# Patient Record
Sex: Female | Born: 1990 | Race: Black or African American | Hispanic: No | Marital: Single | Smoking: Former smoker
Health system: Southern US, Community
[De-identification: ages and names within clinical notes are randomized; demographics above are authoritative.]

## PROBLEM LIST (undated history)

## (undated) ENCOUNTER — Inpatient Hospital Stay: Payer: Self-pay

## (undated) DIAGNOSIS — D649 Anemia, unspecified: Secondary | ICD-10-CM

## (undated) DIAGNOSIS — B001 Herpesviral vesicular dermatitis: Secondary | ICD-10-CM

## (undated) DIAGNOSIS — Z9289 Personal history of other medical treatment: Secondary | ICD-10-CM

## (undated) HISTORY — PX: ABDOMINAL SURGERY: SHX537

## (undated) HISTORY — PX: THERAPEUTIC ABORTION: SHX798

---
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
## 2009-06-01 ENCOUNTER — Emergency Department: Payer: Self-pay | Admitting: Emergency Medicine

## 2009-06-12 ENCOUNTER — Emergency Department: Payer: Self-pay

## 2010-01-18 ENCOUNTER — Emergency Department: Payer: Self-pay | Admitting: Emergency Medicine

## 2010-03-16 ENCOUNTER — Emergency Department: Payer: Self-pay | Admitting: Emergency Medicine

## 2010-04-23 ENCOUNTER — Encounter: Payer: Self-pay | Admitting: Maternal & Fetal Medicine

## 2010-04-25 ENCOUNTER — Emergency Department: Payer: Self-pay | Admitting: Emergency Medicine

## 2010-06-30 ENCOUNTER — Observation Stay: Payer: Self-pay

## 2010-08-03 ENCOUNTER — Observation Stay: Payer: Self-pay

## 2010-08-09 ENCOUNTER — Observation Stay: Payer: Self-pay | Admitting: Obstetrics and Gynecology

## 2010-08-30 ENCOUNTER — Observation Stay: Payer: Self-pay | Admitting: Obstetrics & Gynecology

## 2010-09-03 ENCOUNTER — Observation Stay: Payer: Self-pay | Admitting: Obstetrics and Gynecology

## 2010-09-10 ENCOUNTER — Observation Stay: Payer: Self-pay | Admitting: Obstetrics and Gynecology

## 2010-09-11 ENCOUNTER — Inpatient Hospital Stay: Payer: Self-pay | Admitting: Obstetrics and Gynecology

## 2010-11-27 LAB — HM PAP SMEAR: HM Pap smear: NEGATIVE

## 2011-02-26 ENCOUNTER — Emergency Department: Payer: Self-pay | Admitting: Emergency Medicine

## 2012-08-16 ENCOUNTER — Emergency Department: Payer: Self-pay | Admitting: Emergency Medicine

## 2012-09-25 ENCOUNTER — Emergency Department: Payer: Self-pay | Admitting: Emergency Medicine

## 2012-12-01 ENCOUNTER — Emergency Department: Payer: Self-pay | Admitting: Emergency Medicine

## 2012-12-05 ENCOUNTER — Emergency Department: Payer: Self-pay | Admitting: Internal Medicine

## 2014-03-26 ENCOUNTER — Emergency Department: Payer: Self-pay | Admitting: Internal Medicine

## 2014-04-05 ENCOUNTER — Emergency Department: Payer: Self-pay | Admitting: Emergency Medicine

## 2014-04-16 ENCOUNTER — Emergency Department: Payer: Self-pay | Admitting: Internal Medicine

## 2014-04-16 LAB — CBC WITH DIFFERENTIAL/PLATELET
BASOS ABS: 0 10*3/uL (ref 0.0–0.1)
BASOS PCT: 0.5 %
EOS PCT: 1.2 %
Eosinophil #: 0.1 10*3/uL (ref 0.0–0.7)
HCT: 38.6 % (ref 35.0–47.0)
HGB: 12.1 g/dL (ref 12.0–16.0)
LYMPHS PCT: 35.7 %
Lymphocyte #: 3.1 10*3/uL (ref 1.0–3.6)
MCH: 26.9 pg (ref 26.0–34.0)
MCHC: 31.2 g/dL — ABNORMAL LOW (ref 32.0–36.0)
MCV: 86 fL (ref 80–100)
Monocyte #: 0.9 x10 3/mm (ref 0.2–0.9)
Monocyte %: 10.1 %
NEUTROS ABS: 4.6 10*3/uL (ref 1.4–6.5)
NEUTROS PCT: 52.5 %
Platelet: 420 10*3/uL (ref 150–440)
RBC: 4.48 10*6/uL (ref 3.80–5.20)
RDW: 14.1 % (ref 11.5–14.5)
WBC: 8.7 10*3/uL (ref 3.6–11.0)

## 2014-04-16 LAB — COMPREHENSIVE METABOLIC PANEL
Albumin: 4.1 g/dL (ref 3.4–5.0)
Alkaline Phosphatase: 111 U/L
Anion Gap: 8 (ref 7–16)
BUN: 6 mg/dL — AB (ref 7–18)
Bilirubin,Total: 0.4 mg/dL (ref 0.2–1.0)
CALCIUM: 9 mg/dL (ref 8.5–10.1)
CHLORIDE: 103 mmol/L (ref 98–107)
CREATININE: 0.76 mg/dL (ref 0.60–1.30)
Co2: 28 mmol/L (ref 21–32)
EGFR (Non-African Amer.): >60
Glucose: 82 mg/dL (ref 65–99)
OSMOLALITY: 274 (ref 275–301)
Potassium: 3.7 mmol/L (ref 3.5–5.1)
SGOT(AST): 26 U/L (ref 15–37)
SGPT (ALT): 23 U/L
Sodium: 139 mmol/L (ref 136–145)
Total Protein: 8.6 g/dL — ABNORMAL HIGH (ref 6.4–8.2)

## 2014-04-16 LAB — URINALYSIS, COMPLETE
Bacteria: NONE SEEN
Bilirubin,UR: NEGATIVE
Blood: NEGATIVE
Glucose,UR: NEGATIVE mg/dL (ref 0–75)
Ketone: NEGATIVE
NITRITE: NEGATIVE
PH: 6 (ref 4.5–8.0)
Protein: NEGATIVE
RBC,UR: 12 /HPF (ref 0–5)
SPECIFIC GRAVITY: 1.018 (ref 1.003–1.030)
Squamous Epithelial: 5
WBC UR: 39 /HPF (ref 0–5)

## 2014-04-16 LAB — LIPASE, BLOOD: Lipase: 104 U/L (ref 73–393)

## 2014-04-17 LAB — URINE CULTURE

## 2014-07-07 ENCOUNTER — Emergency Department: Payer: Self-pay | Admitting: Emergency Medicine

## 2014-07-09 ENCOUNTER — Emergency Department: Payer: Self-pay | Admitting: Emergency Medicine

## 2014-07-15 ENCOUNTER — Emergency Department: Payer: Self-pay | Admitting: Emergency Medicine

## 2014-08-02 LAB — OB RESULTS CONSOLE HIV ANTIBODY (ROUTINE TESTING): HIV: NONREACTIVE

## 2014-08-02 LAB — OB RESULTS CONSOLE VARICELLA ZOSTER ANTIBODY, IGG: Varicella: IMMUNE

## 2014-08-02 LAB — OB RESULTS CONSOLE RPR: RPR: NONREACTIVE

## 2014-08-02 LAB — OB RESULTS CONSOLE HEPATITIS B SURFACE ANTIGEN: HEP B S AG: NEGATIVE

## 2014-08-02 LAB — OB RESULTS CONSOLE RUBELLA ANTIBODY, IGM: RUBELLA: IMMUNE

## 2014-08-09 ENCOUNTER — Emergency Department: Payer: Self-pay | Admitting: Emergency Medicine

## 2014-08-12 ENCOUNTER — Ambulatory Visit: Payer: Self-pay

## 2014-10-22 ENCOUNTER — Emergency Department: Payer: Medicaid Other

## 2014-10-22 ENCOUNTER — Encounter: Payer: Self-pay | Admitting: Emergency Medicine

## 2014-10-22 ENCOUNTER — Emergency Department
Admission: EM | Admit: 2014-10-22 | Discharge: 2014-10-22 | Disposition: A | Discharge status: Home / Self Care | Payer: Medicaid Other | Attending: Student | Admitting: Student

## 2014-10-22 ENCOUNTER — Other Ambulatory Visit: Payer: Self-pay

## 2014-10-22 DIAGNOSIS — Z3A19 19 weeks gestation of pregnancy: Secondary | ICD-10-CM | POA: Diagnosis not present

## 2014-10-22 DIAGNOSIS — R52 Pain, unspecified: Secondary | ICD-10-CM

## 2014-10-22 DIAGNOSIS — R1013 Epigastric pain: Secondary | ICD-10-CM | POA: Diagnosis not present

## 2014-10-22 DIAGNOSIS — O9989 Other specified diseases and conditions complicating pregnancy, childbirth and the puerperium: Secondary | ICD-10-CM | POA: Insufficient documentation

## 2014-10-22 DIAGNOSIS — Z79899 Other long term (current) drug therapy: Secondary | ICD-10-CM | POA: Insufficient documentation

## 2014-10-22 DIAGNOSIS — Z87891 Personal history of nicotine dependence: Secondary | ICD-10-CM | POA: Insufficient documentation

## 2014-10-22 LAB — CBC WITH DIFFERENTIAL/PLATELET
BASOS ABS: 0.1 10*3/uL (ref 0–0.1)
Basophils Relative: 1 %
EOS ABS: 0.2 10*3/uL (ref 0–0.7)
EOS PCT: 2 %
HCT: 30.3 % — ABNORMAL LOW (ref 35.0–47.0)
HEMOGLOBIN: 9.9 g/dL — AB (ref 12.0–16.0)
Lymphocytes Relative: 29 %
Lymphs Abs: 3.1 10*3/uL (ref 1.0–3.6)
MCH: 27.3 pg (ref 26.0–34.0)
MCHC: 32.5 g/dL (ref 32.0–36.0)
MCV: 83.9 fL (ref 80.0–100.0)
MONO ABS: 0.7 10*3/uL (ref 0.2–0.9)
Monocytes Relative: 6 %
NEUTROS ABS: 7 10*3/uL — AB (ref 1.4–6.5)
NEUTROS PCT: 62 %
PLATELETS: 285 10*3/uL (ref 150–440)
RBC: 3.61 MIL/uL — AB (ref 3.80–5.20)
RDW: 13.1 % (ref 11.5–14.5)
WBC: 11 10*3/uL (ref 3.6–11.0)

## 2014-10-22 LAB — COMPREHENSIVE METABOLIC PANEL
ALBUMIN: 3.3 g/dL — AB (ref 3.5–5.0)
ALK PHOS: 70 U/L (ref 38–126)
ALT: 8 U/L — AB (ref 14–54)
AST: 21 U/L (ref 15–41)
Anion gap: 11 (ref 5–15)
CO2: 19 mmol/L — AB (ref 22–32)
Calcium: 8.6 mg/dL — ABNORMAL LOW (ref 8.9–10.3)
Chloride: 108 mmol/L (ref 101–111)
Creatinine, Ser: 0.48 mg/dL (ref 0.44–1.00)
GFR calc non Af Amer: >60 mL/min (ref >60)
GLUCOSE: 127 mg/dL — AB (ref 65–99)
Potassium: 3.1 mmol/L — ABNORMAL LOW (ref 3.5–5.1)
Sodium: 138 mmol/L (ref 135–145)
Total Bilirubin: 0.4 mg/dL (ref 0.3–1.2)
Total Protein: 6.7 g/dL (ref 6.5–8.1)

## 2014-10-22 LAB — URINALYSIS COMPLETE WITH MICROSCOPIC (ARMC ONLY)
Bilirubin Urine: NEGATIVE
Glucose, UA: NEGATIVE mg/dL
Hgb urine dipstick: NEGATIVE
KETONES UR: NEGATIVE mg/dL
LEUKOCYTES UA: NEGATIVE
NITRITE: NEGATIVE
PH: 5 (ref 5.0–8.0)
Protein, ur: 30 mg/dL — AB
Specific Gravity, Urine: 1.018 (ref 1.005–1.030)

## 2014-10-22 LAB — HCG, QUANTITATIVE, PREGNANCY: hCG, Beta Chain, Quant, S: 13265 m[IU]/mL — ABNORMAL HIGH (ref <5)

## 2014-10-22 LAB — LIPASE, BLOOD: Lipase: 24 U/L (ref 22–51)

## 2014-10-22 LAB — TROPONIN I: Troponin I: <0.03 ng/mL (ref <0.031)

## 2014-10-22 MED ORDER — SODIUM CHLORIDE 0.9 % IV BOLUS (SEPSIS)
1000.0000 mL | Freq: Once | INTRAVENOUS | Status: AC
  Administered 2014-10-22: 1000 mL via INTRAVENOUS

## 2014-10-22 MED ORDER — ACETAMINOPHEN 500 MG PO TABS
1000.0000 mg | ORAL_TABLET | Freq: Once | ORAL | Status: AC
  Administered 2014-10-22: 1000 mg via ORAL

## 2014-10-22 MED ORDER — ACETAMINOPHEN 500 MG PO TABS
ORAL_TABLET | ORAL | Status: AC
  Administered 2014-10-22: 1000 mg via ORAL
  Filled 2014-10-22: qty 2

## 2014-10-22 NOTE — ED Notes (Signed)
Patient with no complaints at this time. Respirations even and unlabored. Skin warm/dry. Discharge instructions reviewed with patient at this time. Patient given opportunity to voice concerns/ask questions. IV removed per policy and band-aid applied to site. Patient discharged at this time and left Emergency Department with steady gait.  

## 2014-10-22 NOTE — ED Notes (Signed)
Patient states that she is about [redacted] weeks pregnant and that she is having upper abd and left side pain times day and half. Patient denies nausea or vomiting.

## 2014-10-22 NOTE — ED Provider Notes (Addendum)
[facility] Emergency Department Provider Note  ____________________________________________  Time seen: Approximately 3:47 AM  I have reviewed the triage vital signs and the nursing notes.   HISTORY  Chief Complaint Abdominal Pain    HPI Veronica Walters is a 24 y.o. female with no chronic medical problems, G6P3 at approximately 20 weeks estimated gestational age presents for evaluation of 2 days of gradual onset intermittent epigastric and left upper quadrant abdominal pain. She describes the pain as sharp. Current severity is moderate. She has taken "one Tylenol" to treat her pain. She has had no nausea, vomiting, diarrhea, fevers or chills. There are no modifying factors. She denies any chest pain or difficulty breathing. The pain in her abdomen is not worse with deep inspiration. She continues to feel her baby move, she has had no abnormal vaginal bleeding, no loss of fluid from her vagina.   History reviewed. No pertinent past medical history.  There are no active problems to display for this patient.   History reviewed. No pertinent past surgical history.  Current Outpatient Rx  Name  Route  Sig  Dispense  Refill  . Prenatal Vit-Fe Fumarate-FA (PRENATAL MULTIVITAMIN) TABS tablet   Oral   Take 1 tablet by mouth daily at 12 noon.           Allergies Review of patient's allergies indicates no known allergies.  No family history on file.  Social History History  Substance Use Topics  . Smoking status: Former Games developer  . Smokeless tobacco: Not on file  . Alcohol Use: No    Review of Systems Constitutional: No fever/chills Eyes: No visual changes. ENT: No sore throat. Cardiovascular: Denies chest pain. Respiratory: Denies shortness of breath. Gastrointestinal: + abdominal pain.  No nausea, no vomiting.  No diarrhea.  No constipation. Genitourinary: Negative for dysuria. Musculoskeletal: Negative for back pain. Skin: Negative for  rash. Neurological: Negative for headaches, focal weakness or numbness.  10-point ROS otherwise negative.  ____________________________________________   PHYSICAL EXAM:  VITAL SIGNS: ED Triage Vitals  Enc Vitals Group     BP 10/22/14 0145 116/68 mmHg     Pulse Rate 10/22/14 0145 83     Resp 10/22/14 0145 18     Temp 10/22/14 0145 98.3 F (36.8 C)     Temp Source 10/22/14 0145 Oral     SpO2 10/22/14 0145 100 %     Weight 10/22/14 0145 167 lb (75.751 kg)     Height 10/22/14 0145 5\' 3"  (1.6 m)     Head Cir --      Peak Flow --      Pain Score 10/22/14 0146 8     Pain Loc --      Pain Edu? --      Excl. in GC? --     Constitutional: patient is sleeping but awakens to voice and is Alert and oriented. Well appearing and in no acute distress. Eyes: Conjunctivae are normal. PERRL. EOMI. Head: Atraumatic. Nose: No congestion/rhinnorhea. Mouth/Throat: Mucous membranes are moist.  Oropharynx non-erythematous. Neck: No stridor.   Cardiovascular: Normal rate, regular rhythm. Grossly normal heart sounds.  Good peripheral circulation. Respiratory: Normal respiratory effort.  No retractions. Lungs CTAB. Gastrointestinal: Soft, nontender gravid uterus with fundus palpated above the umbilicus; very faint epigastric, right upper quadrant and left upper quadrant tenderness without rebound or guarding. No abdominal bruits. No CVA tenderness. Genitourinary: deferred Musculoskeletal: No lower extremity tenderness nor edema.  No joint effusions. Neurologic:  Normal speech and language.  No gross focal neurologic deficits are appreciated. Speech is normal. No gait instability. Skin:  Skin is warm, dry and intact. No rash noted. Psychiatric: Mood and affect are normal. Speech and behavior are normal.  ____________________________________________   LABS (all labs ordered are listed, but only abnormal results are displayed)  Labs Reviewed  CBC WITH DIFFERENTIAL/PLATELET - Abnormal; Notable for  the following:    RBC 3.61 (*)    Hemoglobin 9.9 (*)    HCT 30.3 (*)    Neutro Abs 7.0 (*)    All other components within normal limits  COMPREHENSIVE METABOLIC PANEL - Abnormal; Notable for the following:    Potassium 3.1 (*)    CO2 19 (*)    Glucose, Bld 127 (*)    BUN <5 (*)    Calcium 8.6 (*)    Albumin 3.3 (*)    ALT 8 (*)    All other components within normal limits  URINALYSIS COMPLETEWITH MICROSCOPIC ([facility] ONLY) - Abnormal; Notable for the following:    Color, Urine YELLOW (*)    APPearance HAZY (*)    Protein, ur 30 (*)    Bacteria, UA RARE (*)    Squamous Epithelial / LPF 0-5 (*)    All other components within normal limits  HCG, QUANTITATIVE, PREGNANCY - Abnormal; Notable for the following:    hCG, Beta Chain, Quant, S 13265 (*)    All other components within normal limits  LIPASE, BLOOD  TROPONIN I   ____________________________________________  EKG  ED ECG REPORT I, Gayla Doss, the attending physician, personally viewed and interpreted this ECG.   Date: 10/22/2014  EKG Time: 01:49  Rate: 75  Rhythm: normal sinus rhythm  Axis: Normal  Intervals:none  ST&T Change: T wave inversion in lead 3, T-wave flattening in aVF, V4, V5, V6  ____________________________________________  RADIOLOGY  RUQ ultrasound Gallbladder:  No gallstones or wall thickening visualized. No sonographic Murphy sign noted.  Common bile duct:  Diameter: 2.9 mm  Liver:  No focal lesion identified. Within normal limits in parenchymal echogenicity.  IMPRESSION: Normal ____________________________________________   PROCEDURES  Procedure(s) performed: None  Critical Care performed: No  ____________________________________________   INITIAL IMPRESSION / ASSESSMENT AND PLAN / ED COURSE  Pertinent labs & imaging results that were available during my care of the patient were reviewed by me and considered in my medical decision making (see chart for  details).  Veronica Walters is a 24 y.o. female with no chronic medical problems, G6P3 at approximately 18 weeks estimated gestational age presents for evaluation of 2 days of gradual onset intermittent epigastric and left upper quadrant abdominal pain. On exam, she is very well-appearing and in no acute distress. Vital signs stable. She is afebrile. Labs are generally unremarkable with the exception of mild anemia which is acceptable given normal physiologic changes in pregnancy. She has faint right upper quadrant tenderness. EKG findings nonspecific and not consistent with  Ischemia, negative troponin. Will obtain ultrasound of the right upper quadrant, give IV fluids, reassess for disposition.   ----------------------------------------- 6:09 AM on 10/22/2014 -----------------------------------------  Patient continues to sleep. I woke her and informed her that her ultrasound is negative. Vital signs remain stable. She still denies any chest pain or difficulty breathing, she has not been hypoxic, tachypnea or tachycardic here in the emergency department and I doubt PE. History and physical not consistent with obstruction or perforation. She has no tenderness to palpation throughout the lower abdomen and I doubt torsion or appendicitis. Normal  fetal heart tones. Discussed pain control with appropriately dosed Tylenol, expedient follow-up with her OB/GYN. We discussed meticulous return precautions and she is comfortable with the discharge plan.  ____________________________________________   FINAL CLINICAL IMPRESSION(S) / ED DIAGNOSES  Final diagnoses:  Pain  Epigastric abdominal pain      Gayla Doss, MD 10/22/14 4098  Gayla Doss, MD 10/22/14 1191  Gayla Doss, MD 10/22/14 [phone]

## 2014-11-07 ENCOUNTER — Emergency Department
Admission: EM | Admit: 2014-11-07 | Discharge: 2014-11-07 | Disposition: A | Discharge status: Home / Self Care | Payer: Medicaid Other | Attending: Emergency Medicine | Admitting: Emergency Medicine

## 2014-11-07 DIAGNOSIS — Z87891 Personal history of nicotine dependence: Secondary | ICD-10-CM | POA: Diagnosis not present

## 2014-11-07 DIAGNOSIS — K0889 Other specified disorders of teeth and supporting structures: Secondary | ICD-10-CM

## 2014-11-07 DIAGNOSIS — K088 Other specified disorders of teeth and supporting structures: Secondary | ICD-10-CM | POA: Insufficient documentation

## 2014-11-07 DIAGNOSIS — O99612 Diseases of the digestive system complicating pregnancy, second trimester: Secondary | ICD-10-CM | POA: Diagnosis not present

## 2014-11-07 DIAGNOSIS — Z3A22 22 weeks gestation of pregnancy: Secondary | ICD-10-CM | POA: Diagnosis not present

## 2014-11-07 DIAGNOSIS — Z79899 Other long term (current) drug therapy: Secondary | ICD-10-CM | POA: Diagnosis not present

## 2014-11-07 MED ORDER — OXYCODONE-ACETAMINOPHEN 5-325 MG PO TABS
1.0000 | ORAL_TABLET | Freq: Once | ORAL | Status: AC
  Administered 2014-11-07: 1 via ORAL

## 2014-11-07 MED ORDER — AMOXICILLIN 500 MG PO CAPS: 500.0000 mg | ORAL_CAPSULE | Freq: Two times a day (BID) | ORAL | Status: DC

## 2014-11-07 MED ORDER — OXYCODONE-ACETAMINOPHEN 5-325 MG PO TABS
ORAL_TABLET | ORAL | Status: AC
  Filled 2014-11-07: qty 1

## 2014-11-07 NOTE — ED Notes (Signed)
Pt c/o right sided tooth pain for the past 2 days..states she had pain about 2-3 weeks ago and it went away.

## 2014-11-07 NOTE — Discharge Instructions (Signed)
Take medication as prescribed. Take over the counter Tylenol as directed per bottle as needed for pain. Drink plenty of fluids. Eat soft food diet. Follow-up with your OB/GYN and primary care physician closely. Follow up with a dentist as soon as possible. Return to the ER for new or worsening concerns.  Dental Pain A tooth ache may be caused by cavities (tooth decay). Cavities expose the nerve of the tooth to air and hot or cold temperatures. It may come from an infection or abscess (also called a boil or furuncle) around your tooth. It is also often caused by dental caries (tooth decay). This causes the pain you are having. DIAGNOSIS  Your caregiver can diagnose this problem by exam. TREATMENT   If caused by an infection, it may be treated with medications which kill germs (antibiotics) and pain medications as prescribed by your caregiver. Take medications as directed.  Only take over-the-counter or prescription medicines for pain, discomfort, or fever as directed by your caregiver.  Whether the tooth ache today is caused by infection or dental disease, you should see your dentist as soon as possible for further care. SEEK MEDICAL CARE IF: The exam and treatment you received today has been provided on an emergency basis only. This is not a substitute for complete medical or dental care. If your problem worsens or new problems (symptoms) appear, and you are unable to meet with your dentist, call or return to this location. SEEK IMMEDIATE MEDICAL CARE IF:   You have a fever.  You develop redness and swelling of your face, jaw, or neck.  You are unable to open your mouth.  You have severe pain uncontrolled by pain medicine. MAKE SURE YOU:   Understand these instructions.  Will watch your condition.  Will get help right away if you are not doing well or get worse. Document Released: 04/29/2005 Document Revised: 07/22/2011 Document Reviewed: 12/16/2007 [facility] Patient Information 2015  [place], [place]. This information is not intended to replace advice given to you by your health care provider. Make sure you discuss any questions you have with your health care provider.  OPTIONS FOR DENTAL FOLLOW UP CARE  [place] [facility] and [facility] Services - Local Safety Net Dental Clinics [email or web address]   [facility] [phone])  [facility] [phone])  [place] [phone])  [facility] [phone])  [facility] [phone]) This clinic caters to the indigent population and is on a lottery system. Location: [employer] [facility], [employer], [address], [place] Clinic Hours: Wednesdays from 6pm - 9pm, patients seen by a lottery system. For dates, call or go to [email or web address] Services: Cleanings, fillings and simple extractions. Payment Options: DENTAL WORK IS FREE OF CHARGE. Bring proof of income or support. Best way to get seen: Arrive at 5:15 pm - this is a lottery, NOT first come/first serve, so arriving earlier will not increase your chances of being seen.     [facility] [phone] Select option 1 for emergencies   Location: [facility], [place], [address] [place] Clinic Hours: No walk-ins accepted - call the day before to schedule an appointment. Check in times are 9:30 am and 1:30 pm. Services: Simple extractions, temporary fillings, pulpectomy/pulp debridement, uncomplicated abscess drainage. Payment Options: PAYMENT IS DUE AT THE TIME OF SERVICE.  Fee is usually $100-200, additional surgical procedures (e.g. abscess drainage) may be extra. Cash, checks, Visa/MasterCard accepted.  Can  file Medicaid if patient is covered for dental - patient should call case worker to check. No discount for [facility] patients. Best way  to get seen: MUST call the day before and get onto the schedule. Can usually be seen the next 1-2 days. No walk-ins accepted.     [facility] [phone]   Location: [facility], [address], [address] Clinic Hours: M, W, Th, F 8am or 1:30pm, Tues 9a or 1:30 - first come/first served. Services: Simple extractions, temporary fillings, uncomplicated abscess drainage.  You do not need to be an [facility] resident. Payment Options: PAYMENT IS DUE AT THE TIME OF SERVICE. Dental insurance, otherwise sliding scale - bring proof of income or support. Depending on income and treatment needed, cost is usually $50-200. Best way to get seen: Arrive early as it is first come/first served.     [facility] [phone]   Location: [address] Clinic Hours: Mon-Thu 8a-5p Services: Most basic dental services including extractions and fillings. Payment Options: PAYMENT IS DUE AT THE TIME OF SERVICE. Sliding scale, up to 50% off - bring proof if income or support. Medicaid with dental option accepted. Best way to get seen: Call to schedule an appointment, can usually be seen within 2 weeks OR they will try to see walk-ins - show up at 8a or 2p (you may have to wait).     [facility] [phone] [place] RESIDENTS ONLY   Location: [facility], [address], [place], [place] [address] Clinic Hours: By appointment only. Monday - Thursday 8am-5pm, Friday 8am-12pm Services: Cleanings, fillings, extractions. Payment Options: PAYMENT IS DUE AT THE TIME OF SERVICE. Cash, Visa or MasterCard. Sliding scale - $30 minimum per service. Best way to get seen: Come in to office, complete packet and make an appointment - need proof of income or support monies for each household member and proof of [facility] residence. Usually takes about a month to get in.      [facility] [phone]   Location: [address] [facility] Clinic Hours: Walk-in Urgent Care Dental Services are offered Monday-Friday mornings only. The numbers of emergencies accepted daily is limited to the number of providers available. Maximum 15 - Mondays, Wednesdays & Thursdays Maximum 10 - Tuesdays & Fridays Services: You do not need to be a [facility] resident to be seen for a dental emergency. Emergencies are defined as pain, swelling, abnormal bleeding, or dental trauma. Walkins will receive x-rays if needed. NOTE: Dental cleaning is not an emergency. Payment Options: PAYMENT IS DUE AT THE TIME OF SERVICE. Minimum co-pay is $40.00 for uninsured patients. Minimum co-pay is $3.00 for Medicaid with dental coverage. Dental Insurance is accepted and must be presented at time of visit. Medicare does not cover dental. Forms of payment: Cash, credit card, checks. Best way to get seen: If not previously registered with the clinic, walk-in dental registration begins at 7:15 am and is on a first come/first serve basis. If previously registered with the clinic, call to make an appointment.     [facility] [phone] [place] RESIDENTS ONLY   Location: [address] [place], [place] Clinic Hours: Mon-Thu 10a-2p Services: Extractions only! Payment Options: FREE (donations accepted) - bring proof of income or support Best way to get seen: Call and schedule an appointment OR come at 8am on the 1st Monday of every month (except for holidays) when  it is first come/first served.     [facility] [phone]   Location: [address], [place] Clinic Hours: Friday mornings Services, Payment Options, Best way to get seen: Call for info

## 2014-11-07 NOTE — ED Provider Notes (Signed)
[facility] Emergency Department Provider Note  ____________________________________________  Time seen: Approximately 1:12 PM  I have reviewed the triage vital signs and the nursing notes.   HISTORY  Chief Complaint Dental Pain   HPI Veronica Walters is a 24 y.o. female presents to ER for complaint of right upper tooth pain. States feels like gum is swollen around tooth. Patient denies fever, drainage or pain radiation. Patient reports continues to drink fluids well and eat well however it hurts to chew food on that side. Patient reports that she has had dental caries in that area but unsure if a broken tooth was present. Denies recently breaking tooth. Denies fall, injury or trauma.  Patient reports that she is 5-1/2 months pregnant. Reports that her OB/GYN is at [facility]. Reports that this pregnancy has not had any complications. Reports next appointment with OB/GYN this next week. Denies abdominal pain, vaginal discharge, dysuria, back pain, vaginal bleeding or other complaints. Patient states that she is here for right upper dental pain.  States now pain is currently 6 out of 10 and aching. Denies other pain. Reports drinking cold or hot fluids increases pain. Patient states that she has been taking over-the-counter Tylenol as needed for pain.   No past medical history on file.  There are no active problems to display for this patient.   No past surgical history on file.  Current Outpatient Rx  Name  Route  Sig  Dispense  Refill  . Prenatal Vit-Fe Fumarate-FA (PRENATAL MULTIVITAMIN) TABS tablet   Oral   Take 1 tablet by mouth daily at 12 noon.           Allergies Review of patient's allergies indicates no known allergies.  No family history on file.  Social History History  Substance Use Topics  . Smoking status: Former Games developer  . Smokeless tobacco: Not on file  . Alcohol Use: No    Review of Systems Constitutional: No fever/chills Eyes:  No visual changes. ENT: No sore throat. Positive for right upper dental pain Cardiovascular: Denies chest pain. Respiratory: Denies shortness of breath. Gastrointestinal: No abdominal pain.  No nausea, no vomiting.  No diarrhea.  No constipation. Genitourinary: Negative for dysuria. Musculoskeletal: Negative for back pain. Skin: Negative for rash. Neurological: Negative for headaches, focal weakness or numbness.  10-point ROS otherwise negative.  ____________________________________________   PHYSICAL EXAM:  VITAL SIGNS: ED Triage Vitals  Enc Vitals Group     BP 11/07/14 1129 111/66 mmHg     Pulse Rate 11/07/14 1129 76     Resp 11/07/14 1129 16     Temp 11/07/14 1129 98.2 F (36.8 C)     Temp Source 11/07/14 1129 Oral     SpO2 11/07/14 1129 99 %     Weight 11/07/14 1129 165 lb (74.844 kg)     Height 11/07/14 1129  (1.6 m)     Head Cir --      Peak Flow --      Pain Score 11/07/14 1130 10     Pain Loc --      Pain Edu? --      Excl. in GC? --     Constitutional: Alert and oriented. Well appearing and in no acute distress. Eyes: Conjunctivae are normal. PERRL. EOMI. Head: Atraumatic. Nose: No congestion/rhinnorhea. Mouth/Throat: Mucous membranes are moist.  Oropharynx non-erythematous. Right upper molar #2 mild to moderate tender to palpation, with multiple dental caries and small fracture, minimal to mild surrounding gum swelling.  Widespread dental caries. No erythema. No fluctuance. No visualized or palpated abscess. Neck: No stridor.  No cervical spine tenderness to palpation. Hematological/Lymphatic/Immunilogical: No cervical lymphadenopathy. Cardiovascular: Normal rate, regular rhythm. Grossly normal heart sounds.  Good peripheral circulation. Respiratory: Normal respiratory effort.  No retractions. Lungs CTAB. Gastrointestinal: Soft and nontender. Gravid abdomen. No CVA tenderness. Musculoskeletal: No lower extremity tenderness nor edema.  No joint  effusions. Neurologic:  Normal speech and language. No gross focal neurologic deficits are appreciated. Speech is normal. No gait instability. Skin:  Skin is warm, dry and intact. No rash noted. Psychiatric: Mood and affect are normal. Speech and behavior are normal.  Fetal Heart tones: 158 ____________________________________________   ____________________________________   INITIAL IMPRESSION / ASSESSMENT AND PLAN / ED COURSE  Pertinent labs & imaging results that were available during my care of the patient were reviewed by me and considered in my medical decision making (see chart for details).  Patient reports pain is unrelieved with over-the-counter Tylenol. Patient states that she needs something else for pain. Discussed risks and benefits of pain medication during pregnancy. Patient states "I don't care, you need to give me something ". Patient states that she needs pain medication. Also discussed with patient need to follow up with dentist for further management. Again discussed risks and benefits of pain medication during pregnancy, and patient request pain medication in ER. We'll give patient times one oral Percocet in ER.   Will discharge patient with oral amoxicillin prescription due to concern for possible infection. Discussed taking over-the-counter Tylenol only as needed for pain and only as directed by OB/GYN. Patient to follow-up with dentist as soon as possible. Also to follow up with OB/GYN this week. Patient verbalized understanding and agreed to plan.Dental clinic information also given.  ____________________________________________   FINAL CLINICAL IMPRESSION(S) / ED DIAGNOSES  Final diagnoses:  Pain, dental      Renford DillsLindsey Merick Kelleher, NP 11/07/14 1817  Governor Rooksebecca Lord, MD 11/10/14 1022

## 2014-11-29 ENCOUNTER — Observation Stay
Admission: EM | Admit: 2014-11-29 | Discharge: 2014-11-29 | Disposition: A | Discharge status: Home / Self Care | Payer: Medicaid Other | Attending: Obstetrics and Gynecology | Admitting: Obstetrics and Gynecology

## 2014-11-29 DIAGNOSIS — O26892 Other specified pregnancy related conditions, second trimester: Secondary | ICD-10-CM | POA: Diagnosis not present

## 2014-11-29 DIAGNOSIS — Z3A25 25 weeks gestation of pregnancy: Secondary | ICD-10-CM | POA: Insufficient documentation

## 2014-11-29 DIAGNOSIS — R109 Unspecified abdominal pain: Secondary | ICD-10-CM | POA: Insufficient documentation

## 2014-11-29 DIAGNOSIS — O26899 Other specified pregnancy related conditions, unspecified trimester: Secondary | ICD-10-CM

## 2014-11-29 LAB — URINALYSIS COMPLETE WITH MICROSCOPIC (ARMC ONLY)
BILIRUBIN URINE: NEGATIVE
GLUCOSE, UA: NEGATIVE mg/dL
HGB URINE DIPSTICK: NEGATIVE
Ketones, ur: NEGATIVE mg/dL
NITRITE: NEGATIVE
PH: 6 (ref 5.0–8.0)
Protein, ur: 30 mg/dL — AB
Specific Gravity, Urine: 1.025 (ref 1.005–1.030)

## 2014-11-29 LAB — CHLAMYDIA/NGC RT PCR (ARMC ONLY)
Chlamydia Tr: NOT DETECTED
N GONORRHOEAE: NOT DETECTED

## 2014-11-29 NOTE — Discharge Instructions (Signed)
Follow up with scheduled appointment. Drink plenty of fluid. Tylenol for pain.

## 2014-11-29 NOTE — Plan of Care (Signed)
Patient still sore on R side. UA neg at this time. Discuss with patient round ligament pain. May take Tylenol for pain. Does not have her next prenatal appointment scheduled. Encouraged to call tomorrow for next appointment and to follow up on urine culture and STD testing. Verbalized understanding. Discharged to home. Loyola MastKaren R Oney Folz, RN

## 2015-01-23 LAB — OB RESULTS CONSOLE HGB/HCT, BLOOD
HEMATOCRIT: 29 %
Hemoglobin: 9.1 g/dL

## 2015-01-23 LAB — OB RESULTS CONSOLE ABO/RH: RH TYPE: NEGATIVE

## 2015-01-23 LAB — OB RESULTS CONSOLE ANTIBODY SCREEN: Antibody Screen: POSITIVE

## 2015-02-27 ENCOUNTER — Observation Stay
Admission: EM | Admit: 2015-02-27 | Discharge: 2015-02-27 | Disposition: A | Discharge status: Home / Self Care | Payer: Medicaid Other | Attending: Certified Nurse Midwife | Admitting: Certified Nurse Midwife

## 2015-02-27 ENCOUNTER — Encounter: Payer: Self-pay | Admitting: *Deleted

## 2015-02-27 DIAGNOSIS — Z3A37 37 weeks gestation of pregnancy: Secondary | ICD-10-CM | POA: Insufficient documentation

## 2015-02-27 LAB — URINALYSIS COMPLETE WITH MICROSCOPIC (ARMC ONLY)
BILIRUBIN URINE: NEGATIVE
Bacteria, UA: NONE SEEN
GLUCOSE, UA: NEGATIVE mg/dL
HGB URINE DIPSTICK: NEGATIVE
KETONES UR: NEGATIVE mg/dL
LEUKOCYTES UA: NEGATIVE
NITRITE: NEGATIVE
Protein, ur: NEGATIVE mg/dL
RBC / HPF: NONE SEEN RBC/hpf (ref 0–5)
SPECIFIC GRAVITY, URINE: 1.006 (ref 1.005–1.030)
WBC, UA: NONE SEEN WBC/hpf (ref 0–5)
pH: 7 (ref 5.0–8.0)

## 2015-02-27 NOTE — Progress Notes (Signed)
L&D Triage Note  24 year old G6 P2032 with EDC=03/15/2015 presented at 3837 5/7 weeks with complaints of contractions/ lower abdominal cramping which worsened in intensity while at work. No LOF. Small amt blood tinged mucus. Baby active. [facility] remarkable for Trichimonas infection, marijuana use, receiving Rhogam at 28 weeks (A neg), occasional tobacco use, and anemia.  Labs: A neg/RI/VI/GBS POS.  Exam: BP 122/78 mmHg  Pulse 81  Temp(Src) 98.7 F (37.1 C) (Axillary)  LMP 06/08/2014  General: appears sleepy, distracted FHR: baseline 130 with accels to 150, mod variability Toco: initially contractions q4-6 min apart, then gradually the contractions spaced out (last two contractions about 16 min apart. Cervix: 3/50% (no change over 2 hours), station now -2  A: IUP at 37 5/7 weeks in prodromal vs false labor  P: DC home with labor precautions Work excuse for today.  Farrel ConnersGUTIERREZ, Dontrel Smethers, CNM

## 2015-03-01 ENCOUNTER — Observation Stay
Admission: EM | Admit: 2015-03-01 | Discharge: 2015-03-02 | Disposition: A | Discharge status: Home / Self Care | Payer: Medicaid Other | Attending: Obstetrics and Gynecology | Admitting: Obstetrics and Gynecology

## 2015-03-01 DIAGNOSIS — O26893 Other specified pregnancy related conditions, third trimester: Principal | ICD-10-CM | POA: Insufficient documentation

## 2015-03-01 DIAGNOSIS — Z3A38 38 weeks gestation of pregnancy: Secondary | ICD-10-CM | POA: Insufficient documentation

## 2015-03-01 DIAGNOSIS — R109 Unspecified abdominal pain: Secondary | ICD-10-CM | POA: Insufficient documentation

## 2015-03-02 ENCOUNTER — Encounter: Payer: Self-pay | Admitting: *Deleted

## 2015-03-02 DIAGNOSIS — R109 Unspecified abdominal pain: Secondary | ICD-10-CM | POA: Diagnosis present

## 2015-03-02 DIAGNOSIS — Z3A38 38 weeks gestation of pregnancy: Secondary | ICD-10-CM | POA: Diagnosis not present

## 2015-03-02 DIAGNOSIS — O26893 Other specified pregnancy related conditions, third trimester: Secondary | ICD-10-CM | POA: Diagnosis not present

## 2015-03-02 MED ORDER — CITRIC ACID-SODIUM CITRATE 334-500 MG/5ML PO SOLN: 30.0000 mL | ORAL | Status: DC | PRN

## 2015-03-02 NOTE — Progress Notes (Signed)
Discussed with pt, Dr Edison PaceJackson's plan of care-Pt tearful, states she is leaving and will go to another hospital. States she wants to have her baby. Reassured appropriately. Pt declining AVS or to sign her D/C paper. States she does not need any more instructions. Agreeable to keeping her next appt, on Monday. Pt left walking with belongings.

## 2015-03-02 NOTE — OB Triage Note (Signed)
Pt arrived with c/o's of "hurting" since 2100. Unable to detail how often pain comes or how long. Indicates R lower abd.

## 2015-03-02 NOTE — OB Triage Note (Signed)
See progress note.

## 2015-03-02 NOTE — Progress Notes (Signed)
Dr Jean RosenthalJackson given report. Dr stating that pt may chose to go home or wait x1h and be re-examined/ assessed.

## 2015-03-06 ENCOUNTER — Encounter: Payer: Self-pay | Admitting: *Deleted

## 2015-03-06 ENCOUNTER — Observation Stay
Admission: EM | Admit: 2015-03-06 | Discharge: 2015-03-06 | Disposition: A | Discharge status: Home / Self Care | Payer: Medicaid Other | Attending: Obstetrics & Gynecology | Admitting: Obstetrics & Gynecology

## 2015-03-06 DIAGNOSIS — O26899 Other specified pregnancy related conditions, unspecified trimester: Secondary | ICD-10-CM | POA: Diagnosis present

## 2015-03-06 DIAGNOSIS — Z3A00 Weeks of gestation of pregnancy not specified: Secondary | ICD-10-CM | POA: Diagnosis not present

## 2015-03-06 DIAGNOSIS — O479 False labor, unspecified: Secondary | ICD-10-CM | POA: Diagnosis present

## 2015-03-06 DIAGNOSIS — M545 Low back pain, unspecified: Secondary | ICD-10-CM

## 2015-03-06 LAB — URINALYSIS COMPLETE WITH MICROSCOPIC (ARMC ONLY)
Bacteria, UA: NONE SEEN
Bilirubin Urine: NEGATIVE
GLUCOSE, UA: NEGATIVE mg/dL
HGB URINE DIPSTICK: NEGATIVE
KETONES UR: NEGATIVE mg/dL
Leukocytes, UA: NEGATIVE
Nitrite: NEGATIVE
Protein, ur: NEGATIVE mg/dL
SPECIFIC GRAVITY, URINE: 1.002 — AB (ref 1.005–1.030)
pH: 7 (ref 5.0–8.0)

## 2015-03-06 MED ORDER — ACETAMINOPHEN 325 MG PO TABS: 650.0000 mg | ORAL_TABLET | ORAL | Status: DC | PRN

## 2015-03-06 MED ORDER — ONDANSETRON HCL 4 MG/2ML IJ SOLN: 4.0000 mg | Freq: Four times a day (QID) | INTRAMUSCULAR | Status: DC | PRN

## 2015-03-06 NOTE — Discharge Instructions (Signed)

## 2015-03-06 NOTE — Final Progress Note (Signed)
Physician Final Progress Note  Patient ID: Veronica Walters MRN: [id number] DOB/AGE: 25/08/1990 25 y.o.  Admit date: 03/06/2015 Admitting provider: Nadara Mustardobert P Jermall Isaacson, MD Discharge date: 03/06/2015  Admission Diagnoses: Low Back Pain  Discharge Diagnoses:  Active Problems:   Low back pain during pregnancy   No s/sx labor, infection.  Consults: None  Significant Findings/ Diagnostic Studies: labs: UA ng  Procedures: NST R A NST procedure was performed with FHR monitoring and a normal baseline established, appropriate time of 20-40 minutes of evaluation, and accels >2 seen w 15x15 characteristics.  Results show a REACTIVE NST.   AF, VSS Abd ND, NT, Gravid FHT 140s Extr No edema SVE 1/70/-2  Discharge Condition: good  Disposition: 01-Home or Self Care  Diet: Regular diet  Discharge Activity: Activity as tolerated     Medication List    ASK your doctor about these medications        amoxicillin 500 MG capsule  Commonly known as:  AMOXIL  Take 1 capsule (500 mg total) by mouth 2 (two) times daily.     prenatal multivitamin Tabs tablet  Take 1 tablet by mouth daily at 12 noon.         Total time spent taking care of this patient: 15 minutes  Signed: Letitia LibraHARRIS,Litha Lamartina PAUL 03/06/2015, 5:17 PM

## 2015-03-09 ENCOUNTER — Observation Stay
Admission: EM | Admit: 2015-03-09 | Discharge: 2015-03-09 | Disposition: A | Discharge status: Home / Self Care | Payer: Medicaid Other | Attending: Obstetrics & Gynecology | Admitting: Obstetrics & Gynecology

## 2015-03-09 DIAGNOSIS — O368130 Decreased fetal movements, third trimester, not applicable or unspecified: Secondary | ICD-10-CM | POA: Diagnosis present

## 2015-03-09 DIAGNOSIS — O26899 Other specified pregnancy related conditions, unspecified trimester: Secondary | ICD-10-CM

## 2015-03-09 DIAGNOSIS — M545 Low back pain, unspecified: Secondary | ICD-10-CM

## 2015-03-09 DIAGNOSIS — Z3A39 39 weeks gestation of pregnancy: Secondary | ICD-10-CM | POA: Diagnosis not present

## 2015-03-09 DIAGNOSIS — O368190 Decreased fetal movements, unspecified trimester, not applicable or unspecified: Secondary | ICD-10-CM | POA: Diagnosis present

## 2015-03-09 NOTE — Discharge Summary (Signed)
Progress Note Decreased Fetal Movement.  Subjective:   Veronica Walters is a 24 y.o. female. She is at 4381w1d gestation. She has noted decreased fetal movement for the last 1 day.    Objective:   117/78, 90, 20, 98.5 RA  General appearance:NAD External fetal monitoring: 140 mod + accels no decels ample fetal movement detected electronically Ultrasound: not performed  Cervical exam: 4/60/-3  Assessment:   Pregnancy at 5781w1d with concerns for decreased fetal movement.   Plan:  Fetal movement normalized, category 1 strip Membranes swept at exam, return in labor progresses. Discharge home Patient expresses understanding of information provided and plan of care.     ----- Ranae Plumberhelsea Ward, MD Attending Obstetrician and Gynecologist [facility]

## 2015-03-09 NOTE — Discharge Instructions (Signed)
Fetal Movement Counts  Patient Name: __________________________________________________ Patient Due Date: ____________________  Performing a fetal movement count is highly recommended in high-risk pregnancies, but it is good for every pregnant woman to do. Your health care provider may ask you to start counting fetal movements at 28 weeks of the pregnancy. Fetal movements often increase:  · After eating a full meal.  · After physical activity.  · After eating or drinking something sweet or cold.  · At rest.  Pay attention to when you feel the baby is most active. This will help you notice a pattern of your baby's sleep and wake cycles and what factors contribute to an increase in fetal movement. It is important to perform a fetal movement count at the same time each day when your baby is normally most active.   HOW TO COUNT FETAL MOVEMENTS  1. Find a quiet and comfortable area to sit or lie down on your left side. Lying on your left side provides the best blood and oxygen circulation to your baby.  2. Write down the day and time on a sheet of paper or in a journal.  3. Start counting kicks, flutters, swishes, rolls, or jabs in a 2-hour period. You should feel at least 10 movements within 2 hours.  4. If you do not feel 10 movements in 2 hours, wait 2-3 hours and count again. Look for a change in the pattern or not enough counts in 2 hours.  SEEK MEDICAL CARE IF:  · You feel less than 10 counts in 2 hours, tried twice.  · There is no movement in over an hour.  · The pattern is changing or taking longer each day to reach 10 counts in 2 hours.  · You feel the baby is not moving as he or she usually does.  Date: ____________ Movements: ____________ Start time: ____________ Finish time: ____________   Date: ____________ Movements: ____________ Start time: ____________ Finish time: ____________  Date: ____________ Movements: ____________ Start time: ____________ Finish time: ____________  Date: ____________ Movements:  ____________ Start time: ____________ Finish time: ____________  Date: ____________ Movements: ____________ Start time: ____________ Finish time: ____________  Date: ____________ Movements: ____________ Start time: ____________ Finish time: ____________  Date: ____________ Movements: ____________ Start time: ____________ Finish time: ____________  Date: ____________ Movements: ____________ Start time: ____________ Finish time: ____________   Date: ____________ Movements: ____________ Start time: ____________ Finish time: ____________  Date: ____________ Movements: ____________ Start time: ____________ Finish time: ____________  Date: ____________ Movements: ____________ Start time: ____________ Finish time: ____________  Date: ____________ Movements: ____________ Start time: ____________ Finish time: ____________  Date: ____________ Movements: ____________ Start time: ____________ Finish time: ____________  Date: ____________ Movements: ____________ Start time: ____________ Finish time: ____________  Date: ____________ Movements: ____________ Start time: ____________ Finish time: ____________   Date: ____________ Movements: ____________ Start time: ____________ Finish time: ____________  Date: ____________ Movements: ____________ Start time: ____________ Finish time: ____________  Date: ____________ Movements: ____________ Start time: ____________ Finish time: ____________  Date: ____________ Movements: ____________ Start time: ____________ Finish time: ____________  Date: ____________ Movements: ____________ Start time: ____________ Finish time: ____________  Date: ____________ Movements: ____________ Start time: ____________ Finish time: ____________  Date: ____________ Movements: ____________ Start time: ____________ Finish time: ____________   Date: ____________ Movements: ____________ Start time: ____________ Finish time: ____________  Date: ____________ Movements: ____________ Start time: ____________ Finish  time: ____________  Date: ____________ Movements: ____________ Start time: ____________ Finish time: ____________  Date: ____________ Movements: ____________ Start time:   ____________ Finish time: ____________  Date: ____________ Movements: ____________ Start time: ____________ Finish time: ____________  Date: ____________ Movements: ____________ Start time: ____________ Finish time: ____________  Date: ____________ Movements: ____________ Start time: ____________ Finish time: ____________   Date: ____________ Movements: ____________ Start time: ____________ Finish time: ____________  Date: ____________ Movements: ____________ Start time: ____________ Finish time: ____________  Date: ____________ Movements: ____________ Start time: ____________ Finish time: ____________  Date: ____________ Movements: ____________ Start time: ____________ Finish time: ____________  Date: ____________ Movements: ____________ Start time: ____________ Finish time: ____________  Date: ____________ Movements: ____________ Start time: ____________ Finish time: ____________  Date: ____________ Movements: ____________ Start time: ____________ Finish time: ____________   Date: ____________ Movements: ____________ Start time: ____________ Finish time: ____________  Date: ____________ Movements: ____________ Start time: ____________ Finish time: ____________  Date: ____________ Movements: ____________ Start time: ____________ Finish time: ____________  Date: ____________ Movements: ____________ Start time: ____________ Finish time: ____________  Date: ____________ Movements: ____________ Start time: ____________ Finish time: ____________  Date: ____________ Movements: ____________ Start time: ____________ Finish time: ____________  Date: ____________ Movements: ____________ Start time: ____________ Finish time: ____________   Date: ____________ Movements: ____________ Start time: ____________ Finish time: ____________  Date: ____________  Movements: ____________ Start time: ____________ Finish time: ____________  Date: ____________ Movements: ____________ Start time: ____________ Finish time: ____________  Date: ____________ Movements: ____________ Start time: ____________ Finish time: ____________  Date: ____________ Movements: ____________ Start time: ____________ Finish time: ____________  Date: ____________ Movements: ____________ Start time: ____________ Finish time: ____________  Date: ____________ Movements: ____________ Start time: ____________ Finish time: ____________   Date: ____________ Movements: ____________ Start time: ____________ Finish time: ____________  Date: ____________ Movements: ____________ Start time: ____________ Finish time: ____________  Date: ____________ Movements: ____________ Start time: ____________ Finish time: ____________  Date: ____________ Movements: ____________ Start time: ____________ Finish time: ____________  Date: ____________ Movements: ____________ Start time: ____________ Finish time: ____________  Date: ____________ Movements: ____________ Start time: ____________ Finish time: ____________     This information is not intended to replace advice given to you by your health care provider. Make sure you discuss any questions you have with your health care provider.     Document Released: 05/29/2006 Document Revised: 05/20/2014 Document Reviewed: 02/24/2012  Elsevier Interactive Patient Education ©2016 [employer]

## 2015-03-09 NOTE — OB Triage Note (Addendum)
Mrs. Veronica Walters presents for lack of fetal movements since early this morning.  Patient states that she also had some spotting this morning that was red and mucousy, approx. plum sized, with no clots. She reports one full meal and several snacks throughout the day. She denies ctx, abdominal pain and LOF.

## 2015-03-10 ENCOUNTER — Observation Stay
Admission: EM | Admit: 2015-03-10 | Discharge: 2015-03-10 | Disposition: A | Discharge status: Home / Self Care | Payer: Medicaid Other | Attending: Obstetrics & Gynecology | Admitting: Obstetrics & Gynecology

## 2015-03-10 DIAGNOSIS — Z3A00 Weeks of gestation of pregnancy not specified: Secondary | ICD-10-CM | POA: Insufficient documentation

## 2015-03-10 LAB — OB RESULTS CONSOLE GBS: GBS: POSITIVE

## 2015-03-10 NOTE — OB Triage Note (Signed)
Pt arrived to [facility] 5 for obs for contractions, last office visit was 02/27/2015 and last hospital visit was 03/08/2015 where membranes were stripped. Pt updated on poc to watch contractions and check if there is cervical change. Will cont to monitor.

## 2015-03-10 NOTE — Discharge Summary (Addendum)
Patient presented for evaluation of labor.  Patient had cervical exam by RN and this was reported to me. I reviewed her vital signs and fetal tracing, both of which were reassuring.  Patient was discharged as she was not laboring.  NST interpretation: Reactive, category 1  Ashlie Mcmenamy, MD Attending Obstetrician and Gynecologist [facility]   

## 2015-03-16 ENCOUNTER — Inpatient Hospital Stay: Payer: Medicaid Other | Admitting: Anesthesiology

## 2015-03-16 ENCOUNTER — Inpatient Hospital Stay
Admission: EM | Admit: 2015-03-16 | Discharge: 2015-03-19 | DRG: 765 | Disposition: A | Discharge status: Home / Self Care | Payer: Medicaid Other | Attending: Obstetrics and Gynecology | Admitting: Obstetrics and Gynecology

## 2015-03-16 ENCOUNTER — Encounter
Admission: EM | Disposition: A | Discharge status: Home / Self Care | Payer: Self-pay | Source: Home / Self Care | Attending: Obstetrics and Gynecology

## 2015-03-16 DIAGNOSIS — O99324 Drug use complicating childbirth: Secondary | ICD-10-CM | POA: Diagnosis present

## 2015-03-16 DIAGNOSIS — Z3A40 40 weeks gestation of pregnancy: Secondary | ICD-10-CM

## 2015-03-16 DIAGNOSIS — O4590 Premature separation of placenta, unspecified, unspecified trimester: Secondary | ICD-10-CM | POA: Clinically undetermined

## 2015-03-16 DIAGNOSIS — F1290 Cannabis use, unspecified, uncomplicated: Secondary | ICD-10-CM | POA: Diagnosis present

## 2015-03-16 DIAGNOSIS — O26893 Other specified pregnancy related conditions, third trimester: Secondary | ICD-10-CM

## 2015-03-16 DIAGNOSIS — Z79899 Other long term (current) drug therapy: Secondary | ICD-10-CM

## 2015-03-16 DIAGNOSIS — O4593 Premature separation of placenta, unspecified, third trimester: Secondary | ICD-10-CM | POA: Diagnosis present

## 2015-03-16 DIAGNOSIS — Z8249 Family history of ischemic heart disease and other diseases of the circulatory system: Secondary | ICD-10-CM

## 2015-03-16 DIAGNOSIS — M545 Low back pain: Secondary | ICD-10-CM

## 2015-03-16 DIAGNOSIS — Z87891 Personal history of nicotine dependence: Secondary | ICD-10-CM | POA: Diagnosis not present

## 2015-03-16 HISTORY — DX: Herpesviral vesicular dermatitis: B00.1

## 2015-03-16 HISTORY — DX: Anemia, unspecified: D64.9

## 2015-03-16 LAB — URINE DRUG SCREEN, QUALITATIVE (ARMC ONLY)
AMPHETAMINES, UR SCREEN: NOT DETECTED
Barbiturates, Ur Screen: NOT DETECTED
Benzodiazepine, Ur Scrn: NOT DETECTED
Cannabinoid 50 Ng, Ur ~~LOC~~: NOT DETECTED
Cocaine Metabolite,Ur ~~LOC~~: NOT DETECTED
MDMA (ECSTASY) UR SCREEN: NOT DETECTED
Methadone Scn, Ur: NOT DETECTED
OPIATE, UR SCREEN: NOT DETECTED
PHENCYCLIDINE (PCP) UR S: NOT DETECTED
Tricyclic, Ur Screen: NOT DETECTED

## 2015-03-16 LAB — RAPID HIV SCREEN (HIV 1/2 AB+AG)
HIV 1/2 Antibodies: NONREACTIVE
HIV-1 P24 Antigen - HIV24: NONREACTIVE

## 2015-03-16 LAB — CBC
HEMATOCRIT: 32.3 % — AB (ref 35.0–47.0)
Hemoglobin: 10.2 g/dL — ABNORMAL LOW (ref 12.0–16.0)
MCH: 25.1 pg — ABNORMAL LOW (ref 26.0–34.0)
MCHC: 31.7 g/dL — AB (ref 32.0–36.0)
MCV: 79.3 fL — AB (ref 80.0–100.0)
Platelets: 323 10*3/uL (ref 150–440)
RBC: 4.07 MIL/uL (ref 3.80–5.20)
RDW: 15.3 % — AB (ref 11.5–14.5)
WBC: 12.8 10*3/uL — ABNORMAL HIGH (ref 3.6–11.0)

## 2015-03-16 LAB — TYPE AND SCREEN
ABO/RH(D): A NEG
ANTIBODY SCREEN: NEGATIVE

## 2015-03-16 LAB — ABO/RH: ABO/RH(D): A NEG

## 2015-03-16 SURGERY — Surgical Case
Anesthesia: Epidural | Wound class: Clean Contaminated

## 2015-03-16 MED ORDER — DIBUCAINE 1 % RE OINT: 1.0000 "application " | TOPICAL_OINTMENT | RECTAL | Status: DC | PRN

## 2015-03-16 MED ORDER — NALBUPHINE HCL 10 MG/ML IJ SOLN: 5.0000 mg | Freq: Once | INTRAMUSCULAR | Status: DC | PRN

## 2015-03-16 MED ORDER — AMMONIA AROMATIC IN INHA
RESPIRATORY_TRACT | Status: AC
  Filled 2015-03-16: qty 10

## 2015-03-16 MED ORDER — MISOPROSTOL 200 MCG PO TABS
ORAL_TABLET | ORAL | Status: AC
  Filled 2015-03-16: qty 3

## 2015-03-16 MED ORDER — NALOXONE HCL 0.4 MG/ML IJ SOLN: 0.4000 mg | INTRAMUSCULAR | Status: DC | PRN

## 2015-03-16 MED ORDER — LIDOCAINE HCL (PF) 1 % IJ SOLN
INTRAMUSCULAR | Status: AC
  Filled 2015-03-16: qty 30

## 2015-03-16 MED ORDER — LACTATED RINGERS IV SOLN
500.0000 mL | INTRAVENOUS | Status: DC | PRN
  Administered 2015-03-16: 07:00:00 via INTRAVENOUS

## 2015-03-16 MED ORDER — MEPERIDINE HCL 25 MG/ML IJ SOLN: 6.2500 mg | INTRAMUSCULAR | Status: DC | PRN

## 2015-03-16 MED ORDER — WITCH HAZEL-GLYCERIN EX PADS: 1.0000 "application " | MEDICATED_PAD | CUTANEOUS | Status: DC | PRN

## 2015-03-16 MED ORDER — OXYTOCIN BOLUS FROM INFUSION: 500.0000 mL | INTRAVENOUS | Status: DC

## 2015-03-16 MED ORDER — MISOPROSTOL 200 MCG PO TABS
ORAL_TABLET | ORAL | Status: AC
  Filled 2015-03-16: qty 4

## 2015-03-16 MED ORDER — BUPIVACAINE HCL (PF) 0.5 % IJ SOLN
INTRAMUSCULAR | Status: AC
  Filled 2015-03-16: qty 30

## 2015-03-16 MED ORDER — OXYTOCIN 40 UNITS IN LACTATED RINGERS INFUSION - SIMPLE MED
62.5000 mL/h | INTRAVENOUS | Status: DC
  Administered 2015-03-16: 62.5 mL/h via INTRAVENOUS
  Administered 2015-03-16: 200 mL via INTRAVENOUS
  Filled 2015-03-16: qty 1000

## 2015-03-16 MED ORDER — FENTANYL CITRATE (PF) 100 MCG/2ML IJ SOLN
INTRAMUSCULAR | Status: DC | PRN
  Administered 2015-03-16: 250 ug via INTRAVENOUS

## 2015-03-16 MED ORDER — SIMETHICONE 80 MG PO CHEW
80.0000 mg | CHEWABLE_TABLET | Freq: Three times a day (TID) | ORAL | Status: DC
  Administered 2015-03-17 – 2015-03-19 (×8): 80 mg via ORAL
  Filled 2015-03-16 (×8): qty 1

## 2015-03-16 MED ORDER — LIDOCAINE HCL (PF) 1 % IJ SOLN: 30.0000 mL | INTRAMUSCULAR | Status: DC | PRN

## 2015-03-16 MED ORDER — NALBUPHINE HCL 10 MG/ML IJ SOLN: 5.0000 mg | INTRAMUSCULAR | Status: DC | PRN

## 2015-03-16 MED ORDER — OXYTOCIN 10 UNIT/ML IJ SOLN: 10.0000 [IU] | Freq: Once | INTRAMUSCULAR | Status: DC

## 2015-03-16 MED ORDER — ONDANSETRON HCL 4 MG/2ML IJ SOLN: 4.0000 mg | Freq: Four times a day (QID) | INTRAMUSCULAR | Status: DC | PRN

## 2015-03-16 MED ORDER — DIPHENHYDRAMINE HCL 12.5 MG/5ML PO ELIX
12.5000 mg | ORAL_SOLUTION | Freq: Four times a day (QID) | ORAL | Status: DC | PRN
  Filled 2015-03-16: qty 5

## 2015-03-16 MED ORDER — LIDOCAINE-EPINEPHRINE (PF) 1.5 %-1:200000 IJ SOLN
INTRAMUSCULAR | Status: AC | PRN
  Administered 2015-03-16: 3 mL via PERINEURAL

## 2015-03-16 MED ORDER — PRENATAL MULTIVITAMIN CH
1.0000 | ORAL_TABLET | Freq: Every day | ORAL | Status: DC
  Administered 2015-03-17 – 2015-03-19 (×3): 1 via ORAL
  Filled 2015-03-16 (×3): qty 1

## 2015-03-16 MED ORDER — OXYTOCIN 10 UNIT/ML IJ SOLN
INTRAMUSCULAR | Status: AC
  Filled 2015-03-16: qty 2

## 2015-03-16 MED ORDER — CEFAZOLIN SODIUM-DEXTROSE 2-3 GM-% IV SOLR
INTRAVENOUS | Status: AC
  Administered 2015-03-16: 2 g
  Filled 2015-03-16: qty 50

## 2015-03-16 MED ORDER — IBUPROFEN 600 MG PO TABS: 600.0000 mg | ORAL_TABLET | Freq: Four times a day (QID) | ORAL | Status: DC | PRN

## 2015-03-16 MED ORDER — LACTATED RINGERS IV SOLN
INTRAVENOUS | Status: DC
  Administered 2015-03-17 (×2): via INTRAVENOUS

## 2015-03-16 MED ORDER — ONDANSETRON HCL 4 MG/2ML IJ SOLN: 4.0000 mg | Freq: Three times a day (TID) | INTRAMUSCULAR | Status: DC | PRN

## 2015-03-16 MED ORDER — CITRIC ACID-SODIUM CITRATE 334-500 MG/5ML PO SOLN
ORAL | Status: AC
  Administered 2015-03-16: 30 mL via ORAL
  Filled 2015-03-16: qty 15

## 2015-03-16 MED ORDER — MENTHOL 3 MG MT LOZG
1.0000 | LOZENGE | OROMUCOSAL | Status: DC | PRN
  Filled 2015-03-16: qty 9

## 2015-03-16 MED ORDER — OXYTOCIN 40 UNITS IN LACTATED RINGERS INFUSION - SIMPLE MED
INTRAVENOUS | Status: AC
  Administered 2015-03-16: 62.5 mL/h via INTRAVENOUS
  Filled 2015-03-16: qty 1000

## 2015-03-16 MED ORDER — SODIUM CHLORIDE 0.9 % IV SOLN
2.0000 g | Freq: Once | INTRAVENOUS | Status: AC
  Administered 2015-03-16: 2 g via INTRAVENOUS

## 2015-03-16 MED ORDER — CITRIC ACID-SODIUM CITRATE 334-500 MG/5ML PO SOLN
30.0000 mL | ORAL | Status: DC | PRN
  Administered 2015-03-16: 30 mL via ORAL

## 2015-03-16 MED ORDER — PHENYLEPHRINE HCL 10 MG/ML IJ SOLN
INTRAMUSCULAR | Status: DC | PRN
  Administered 2015-03-16: 100 ug via INTRAVENOUS

## 2015-03-16 MED ORDER — IBUPROFEN 600 MG PO TABS
600.0000 mg | ORAL_TABLET | Freq: Four times a day (QID) | ORAL | Status: DC
  Administered 2015-03-17 – 2015-03-18 (×6): 600 mg via ORAL
  Filled 2015-03-16 (×7): qty 1

## 2015-03-16 MED ORDER — DIPHENHYDRAMINE HCL 50 MG/ML IJ SOLN: 12.5000 mg | INTRAMUSCULAR | Status: DC | PRN

## 2015-03-16 MED ORDER — SODIUM CHLORIDE 0.9 % IJ SOLN: 3.0000 mL | INTRAMUSCULAR | Status: DC | PRN

## 2015-03-16 MED ORDER — PROPOFOL 10 MG/ML IV BOLUS
INTRAVENOUS | Status: DC | PRN
  Administered 2015-03-16: 200 mg via INTRAVENOUS

## 2015-03-16 MED ORDER — SENNOSIDES-DOCUSATE SODIUM 8.6-50 MG PO TABS
2.0000 | ORAL_TABLET | ORAL | Status: DC
  Administered 2015-03-17 – 2015-03-19 (×3): 2 via ORAL
  Filled 2015-03-16 (×3): qty 2

## 2015-03-16 MED ORDER — DIPHENHYDRAMINE HCL 50 MG/ML IJ SOLN: 12.5000 mg | Freq: Four times a day (QID) | INTRAMUSCULAR | Status: DC | PRN

## 2015-03-16 MED ORDER — SODIUM CHLORIDE 0.9 % IV SOLN
INTRAVENOUS | Status: AC
  Administered 2015-03-16: 2 g via INTRAVENOUS
  Filled 2015-03-16: qty 2000

## 2015-03-16 MED ORDER — FENTANYL 2.5 MCG/ML W/ROPIVACAINE 0.2% IN NS 100 ML EPIDURAL INFUSION (ARMC-ANES)
EPIDURAL | Status: AC
  Filled 2015-03-16: qty 100

## 2015-03-16 MED ORDER — SUCCINYLCHOLINE CHLORIDE 20 MG/ML IJ SOLN
INTRAMUSCULAR | Status: DC | PRN
  Administered 2015-03-16: 100 mg via INTRAVENOUS

## 2015-03-16 MED ORDER — MIDAZOLAM HCL 2 MG/2ML IJ SOLN
INTRAMUSCULAR | Status: DC | PRN
  Administered 2015-03-16: 2 mg via INTRAVENOUS

## 2015-03-16 MED ORDER — OXYTOCIN 40 UNITS IN LACTATED RINGERS INFUSION - SIMPLE MED: 62.5000 mL/h | INTRAVENOUS | Status: AC

## 2015-03-16 MED ORDER — OXYCODONE-ACETAMINOPHEN 5-325 MG PO TABS
2.0000 | ORAL_TABLET | ORAL | Status: DC | PRN
  Administered 2015-03-17 – 2015-03-19 (×13): 2 via ORAL
  Filled 2015-03-16 (×13): qty 2

## 2015-03-16 MED ORDER — SCOPOLAMINE 1 MG/3DAYS TD PT72
1.0000 | MEDICATED_PATCH | Freq: Once | TRANSDERMAL | Status: DC
  Filled 2015-03-16: qty 1

## 2015-03-16 MED ORDER — MORPHINE SULFATE 2 MG/ML IV SOLN
INTRAVENOUS | Status: DC
  Administered 2015-03-16 (×4): 2 mg via INTRAVENOUS
  Administered 2015-03-16: 09:00:00 via INTRAVENOUS
  Administered 2015-03-17: 2 mg via INTRAVENOUS
  Filled 2015-03-16 (×2): qty 25

## 2015-03-16 MED ORDER — OXYCODONE-ACETAMINOPHEN 5-325 MG PO TABS: 1.0000 | ORAL_TABLET | ORAL | Status: DC | PRN

## 2015-03-16 MED ORDER — BUPIVACAINE ON-Q PAIN PUMP (FOR ORDER SET NO CHG)
INJECTION | Status: DC
  Filled 2015-03-16: qty 1

## 2015-03-16 MED ORDER — FENTANYL 2.5 MCG/ML W/ROPIVACAINE 0.2% IN NS 100 ML EPIDURAL INFUSION (ARMC-ANES): 10.0000 mL/h | EPIDURAL | Status: DC

## 2015-03-16 MED ORDER — FERROUS SULFATE 325 (65 FE) MG PO TABS
325.0000 mg | ORAL_TABLET | Freq: Two times a day (BID) | ORAL | Status: DC
  Administered 2015-03-16 – 2015-03-19 (×6): 325 mg via ORAL
  Filled 2015-03-16 (×6): qty 1

## 2015-03-16 MED ORDER — NALOXONE HCL 2 MG/2ML IJ SOSY: 1.0000 ug/kg/h | PREFILLED_SYRINGE | INTRAVENOUS | Status: DC | PRN

## 2015-03-16 MED ORDER — SODIUM CHLORIDE 0.9 % IJ SOLN: 9.0000 mL | INTRAMUSCULAR | Status: DC | PRN

## 2015-03-16 MED ORDER — DIPHENHYDRAMINE HCL 25 MG PO CAPS: 25.0000 mg | ORAL_CAPSULE | ORAL | Status: DC | PRN

## 2015-03-16 MED ORDER — BUPIVACAINE 0.25 % ON-Q PUMP DUAL CATH 300 ML
INJECTION | Status: DC | PRN
  Administered 2015-03-16: 30 mL

## 2015-03-16 MED ORDER — BUPIVACAINE 0.25 % ON-Q PUMP DUAL CATH 400 ML
INJECTION | Status: AC
  Filled 2015-03-16: qty 400

## 2015-03-16 MED ORDER — LANOLIN HYDROUS EX OINT: 1.0000 "application " | TOPICAL_OINTMENT | CUTANEOUS | Status: DC | PRN

## 2015-03-16 MED ORDER — OXYTOCIN 40 UNITS IN LACTATED RINGERS INFUSION - SIMPLE MED
INTRAVENOUS | Status: AC
  Filled 2015-03-16: qty 1000

## 2015-03-16 MED ORDER — LACTATED RINGERS IV SOLN
INTRAVENOUS | Status: DC
  Administered 2015-03-16: 03:00:00 via INTRAVENOUS

## 2015-03-16 MED ORDER — DIPHENHYDRAMINE HCL 25 MG PO CAPS
25.0000 mg | ORAL_CAPSULE | Freq: Four times a day (QID) | ORAL | Status: DC | PRN
  Administered 2015-03-16: 25 mg via ORAL
  Filled 2015-03-16: qty 1

## 2015-03-16 SURGICAL SUPPLY — 27 items
CANISTER SUCT 3000ML (MISCELLANEOUS) ×3 IMPLANT
CATH KIT ON-Q SILVERSOAK 5IN (CATHETERS) ×6 IMPLANT
CLOSURE WOUND 1/2 X4 (GAUZE/BANDAGES/DRESSINGS) ×1
DRSG TELFA 3X8 NADH (GAUZE/BANDAGES/DRESSINGS) ×3 IMPLANT
ELECT CAUTERY BLADE 6.4 (BLADE) ×3 IMPLANT
GAUZE SPONGE 4X4 12PLY STRL (GAUZE/BANDAGES/DRESSINGS) ×3 IMPLANT
GLOVE BIO SURGEON STRL SZ7 (GLOVE) ×3 IMPLANT
GLOVE INDICATOR 7.5 STRL GRN (GLOVE) ×3 IMPLANT
GOWN STRL REUS W/ TWL LRG LVL3 (GOWN DISPOSABLE) ×3 IMPLANT
GOWN STRL REUS W/TWL LRG LVL3 (GOWN DISPOSABLE) ×6
LIQUID BAND (GAUZE/BANDAGES/DRESSINGS) ×3 IMPLANT
NS IRRIG 1000ML POUR BTL (IV SOLUTION) ×3 IMPLANT
PACK C SECTION AR (MISCELLANEOUS) ×3 IMPLANT
PAD GROUND ADULT SPLIT (MISCELLANEOUS) ×3 IMPLANT
PAD OB MATERNITY 4.3X12.25 (PERSONAL CARE ITEMS) ×6 IMPLANT
PAD PREP 24X41 OB/GYN DISP (PERSONAL CARE ITEMS) ×3 IMPLANT
SPONGE LAP 18X18 5 PK (GAUZE/BANDAGES/DRESSINGS) ×6 IMPLANT
STRIP CLOSURE SKIN 1/2X4 (GAUZE/BANDAGES/DRESSINGS) ×2 IMPLANT
SUT CHROMIC GUT BROWN 0 54 (SUTURE) ×1 IMPLANT
SUT CHROMIC GUT BROWN 0 54IN (SUTURE) ×3
SUT MNCRL 4-0 (SUTURE) ×2
SUT MNCRL 4-0 27XMFL (SUTURE) ×1
SUT PDS AB 1 TP1 96 (SUTURE) ×3 IMPLANT
SUT PLAIN 2 0 XLH (SUTURE) ×3 IMPLANT
SUT VIC AB 0 CT1 36 (SUTURE) ×12 IMPLANT
SUTURE MNCRL 4-0 27XMF (SUTURE) ×1 IMPLANT
SWABSTK COMLB BENZOIN TINCTURE (MISCELLANEOUS) ×3 IMPLANT

## 2015-03-16 NOTE — Anesthesia Procedure Notes (Signed)
Procedure Name: Intubation Date/Time: 03/16/2015 6:39 AM Performed by: Waldo LaineJUSTIS, Veronica Nestor Pre-anesthesia Checklist: Patient identified, Emergency Drugs available, Suction available and Patient being monitored Patient Re-evaluated:Patient Re-evaluated prior to inductionOxygen Delivery Method: Circle system utilized Preoxygenation: Pre-oxygenation with 100% oxygen Intubation Type: IV induction, Cricoid Pressure applied and Rapid sequence Laryngoscope Size: Miller and 2 Grade View: Grade II Tube type: Oral Number of attempts: 1 Airway Equipment and Method: Stylet Placement Confirmation: ETT inserted through vocal cords under direct vision,  positive ETCO2,  CO2 detector and breath sounds checked- equal and bilateral Secured at: 21 cm Tube secured with: Tape Dental Injury: Teeth and Oropharynx as per pre-operative assessment

## 2015-03-16 NOTE — Op Note (Signed)
Cesarean Section Procedure Note   Veronica BabeKiair J Wentzell   03/16/2015 7:36 AM   Pre-operative Diagnosis:  1) Intrauterine pregnancy at 2630w1d  2) fetal intolerance of labor  Post-operative Diagnosis:  1) Intrauterine pregnancy at 7430w1d  2) fetal intolerance of labor 3) abruptio placentae  Procedure: Primary low transverse cesarean section via pfannenstiel incision  Surgeon: Surgeon(s) and Role:    * Conard NovakStephen D Jackson, MD - Primary   [facility]  Anesthesia: general   Findings:  1) normal appearing gravid uterus, fallopian tubes, and ovaries 2) large blood clots coming out of uterus upon entry into uterine cavity   Estimated Blood Loss: 1,000 mL  Total IV Fluids: 700 ml   Specimens: placenta for permanent  Complications: no complications  Disposition: PACU - hemodynamically stable.   Maternal Condition: stable   Baby condition / location:  Nursery  Procedure Details:  I was called to see the patient given a persistent fetal heart rate in the 80s for about 4-5 minutes despite resuscitative measures.  It was noted that she had passed several blood clots and had no cervical change.  After a couple of minutes of trying to get the fetal heart rate to stabilize given there was no abdominal rigidity and no persistent vaginal bleeding, a decision was made to proceed with an emergent cesarean section.  The patient was informed of our concerns and voiced that she wanted the cesarean section to keep the baby safe.  She was rushed to the OR.  The patient concurred with the proposed plan, giving informed consent. identified as Veronica Walters and the procedure verified as C-Section Delivery. A Time Out was held and the above information confirmed.   The patient was draped and prepped in the usual sterile manner. Induction of general anesthesia was undertaken.  A Claretha CooperJoel Cohen incision was made in an expeditious manner. The peritoneum was identified and entered. Peritoneal incision  was extended longitudinally. The bladder flap was not bluntly freed from the lower uterine segment. A low transverse uterine incision was made and the hysterotomy was extended with cranial-caudal tension. At this point several small, dark hematomas were noted to be coming from inside the uterus.  The gestational sac was bluntly ruptured.  Delivered from cephalic presentation was a 3,530 gram Living newborn infant(s) or Female with Apgar scores of 9 at one minute and 9 at five minutes. Cord ph was sent the umbilical cord was clamped and cut cord blood was obtained for evaluation. The placenta was removed Intact and appeared normal. The uterine outline, tubes and ovaries appeared normal. The uterine incision was closed with running locked sutures of 0 Vicryl.  A second layer of the same suture was thrown in an imbricating fashion.  Hemostasis was assured.  The uterus was retained to the abdomen and the paracolic gutters were cleared of all clots and debris.  The peritoneum was reapproximated using 0-Vicryl in a running fashion.  The rectus muscles were inspected and found to be hemostatic.  The On-Q catheter pumps were inserted in accordance with the manufacturer's recommendations.  The catheters were inserted approximately 4cm cephelad to the incision line, approximately 1cm apart, straddling the midline.  They were inserted to a depth of the 4th mark. They were positioned superficial to the rectus abdominus muscles and deep to the rectus fascia.    The fascia was then reapproximated with running sutures of 1-0 PDS, looped. The skin was closed using staples.  The On-Q catheters were bolused with 5  mL of 0.5% marcaine plain for a total of 10 mL.  The catheters were affixed to the skin with surgical skin glue, steri-strips, and tegaderm.    Instrument, sponge, and needle counts were correct prior the abdominal closure and were correct at the conclusion of the case.  The patient received Ancef 2 gram IV prior to  skin incision (within 30 minutes).   Signed: Conard Novak, MD 03/16/2015 7:36 AM

## 2015-03-16 NOTE — Progress Notes (Signed)
Late Entry event note: Called to see the patient at about 6:20am due to fetal heart rate in the 80s.  She had already had several resuscitative measures undertaken, including maternal position changes, O2 application, and IV fluid bolus.  I checked her cervix and she was 7cm at best.  There were several other small clots on my glove.  By 6:24am I made the call for emergent cesarean section. The fetal heart rate had been in the 80s since about 6:15am.  The need for the procedure was quickly explained to the patient and she readily and appropriately agreed.  She had her epidural for a while and her blood pressure was normal.  She was rushed to the OR at 6:24am and at about 6:35 am the fetal heart rate was checked again and found to be in the 80s.  We then moved expeditiously with an emergent cesarean delivery.

## 2015-03-16 NOTE — Transfer of Care (Signed)
Immediate Anesthesia Transfer of Care Note  Patient: Veronica Walters  Procedure(s) Performed: Procedure(s): CESAREAN SECTION  Patient Location: PACU  Anesthesia Type:General  Level of Consciousness: awake, alert , oriented and patient cooperative  Airway & Oxygen Therapy: Patient Spontanous Breathing  Post-op Assessment: Report given to RN  Post vital signs: Reviewed and stable  Last Vitals:  Filed Vitals:   03/16/15 0430  BP: 113/69  Pulse: 78    Complications: No apparent anesthesia complications

## 2015-03-16 NOTE — Anesthesia Preprocedure Evaluation (Signed)
Anesthesia Evaluation  Patient identified by MRN, date of birth, ID band Patient awake    Reviewed: Allergy & Precautions, NPO status   History of Anesthesia Complications Negative for: history of anesthetic complications  Airway Mallampati: II       Dental  (+) Teeth Intact   Pulmonary neg pulmonary ROS, former smoker,           Cardiovascular negative cardio ROS       Neuro/Psych negative neurological ROS     GI/Hepatic negative GI ROS, Neg liver ROS,   Endo/Other  negative endocrine ROS  Renal/GU negative Renal ROS     Musculoskeletal   Abdominal   Peds  Hematology negative hematology ROS (+) anemia ,   Anesthesia Other Findings   Reproductive/Obstetrics                             Anesthesia Physical Anesthesia Plan  ASA: II  Anesthesia Plan: Epidural   Post-op Pain Management:    Induction:   Airway Management Planned:   Additional Equipment:   Intra-op Plan:   Post-operative Plan:   Informed Consent: I have reviewed the patients History and Physical, chart, labs and discussed the procedure including the risks, benefits and alternatives for the proposed anesthesia with the patient or authorized representative who has indicated his/her understanding and acceptance.     Plan Discussed with:   Anesthesia Plan Comments:         Anesthesia Quick Evaluation

## 2015-03-16 NOTE — Discharge Summary (Signed)
OB Discharge Summary  Patient Name: Veronica Walters DOB: 09/26/1990 MRN: [id number]  Date of admission: 03/16/2015 Delivering MD: Thomasene MohairStephen Genesis Paget, MD Date of Delivery: 03/16/2015  Date of discharge: 03/19/2015  Admitting diagnosis: contractions   Intrauterine pregnancy: 5733w1d      Secondary diagnosis: None     Discharge diagnosis: Term Pregnancy Delivered and abruptio placentae                                                                                                Post partum procedures:Rhogam and influenza vaccine  Augmentation: none  Complications: Placental Abruption  Hospital course:  The patient was admitted in active labor.  There was a fetal heart rate deceleration to the 80s that never improved.  She was taken emergently to the Operating Room for a stat Cesarean Delivery, which occurred without incident under general anesthesia.  The infant APGARs were 9 at one minute and 9 at five minutes.  The cord pH was 9.13 and base excess was -7.5.   She had a routine postoperative/postpartum course.  By POD#3 she was tolerating a PO diet, ambulating, pain was well-controlled on PO pain meds, and was voiding spontaneously.  Her staples were removed on POD#3 and she had benzoin with steri-strips placed.  She received rhogam and the influenza vaccine.  She desires Depo for her contraception.  She was mostly formula feeding at discharge, but was beginning to try breast feeding.  Physical exam  Filed Vitals:   03/18/15 1940 03/18/15 2340 03/19/15 0405 03/19/15 0811  BP: 124/81 113/69  117/73  Pulse: 68 84  68  Temp: 97.7 F (36.5 C) 98.3 F (36.8 C) 98.2 F (36.8 C) 98.6 F (37 C)  TempSrc: Oral Oral Oral Oral  Resp: 20 20  20   SpO2: 100% 99%  99%   General: alert Lochia: appropriate Uterine Fundus: firm Incision: Healing well with no significant drainage DVT Evaluation: No evidence of DVT seen on physical exam.  Labs: Lab Results  Component Value Date   WBC 17.1*  03/17/2015   HGB 8.6* 03/17/2015   HCT 27.2* 03/17/2015   MCV 79.4* 03/17/2015   PLT 291 03/17/2015   CMP Latest Ref Rng 10/22/2014  Glucose 65 - 99 mg/dL 295(A127(H)  BUN 6 - 20 mg/dL <2(Z<5(L)  Creatinine 3.080.44 - 1.00 mg/dL 6.570.48  Sodium 846135 - 962145 mmol/L 138  Potassium 3.5 - 5.1 mmol/L 3.1(L)  Chloride 101 - 111 mmol/L 108  CO2 22 - 32 mmol/L 19(L)  Calcium 8.9 - 10.3 mg/dL 9.5(M8.6(L)  Total Protein 6.5 - 8.1 g/dL 6.7  Total Bilirubin 0.3 - 1.2 mg/dL 0.4  Alkaline Phos 38 - 126 U/L 70  AST 15 - 41 U/L 21  ALT 14 - 54 U/L 8(L)    Discharge instruction: per After Visit Summary.  Medications:    Medication List    STOP taking these medications        amoxicillin 500 MG capsule  Commonly known as:  AMOXIL     valACYclovir 1000 MG tablet  Commonly known as:  VALTREX      TAKE these medications  ferrous sulfate 325 (65 FE) MG tablet  Take 1 tablet (325 mg total) by mouth 2 (two) times daily with a meal.     ibuprofen 600 MG tablet  Commonly known as:  ADVIL,MOTRIN  Take 1 tablet (600 mg total) by mouth every 6 (six) hours.     oxyCODONE-acetaminophen 5-325 MG tablet  Commonly known as:  ROXICET  Take 2 tablets by mouth every 6 (six) hours as needed for moderate pain or severe pain.     prenatal multivitamin Tabs tablet  Take 1 tablet by mouth daily at 12 noon.        Diet: routine diet  Activity: Advance as tolerated. Pelvic rest for 6 weeks.   Outpatient follow up: Follow-up Information    Follow up with Conard Novak, MD. Call on 03/20/2015.   Specialty:  Obstetrics and Gynecology   Why:  call [facility] on monday november 7th to make appointment for either Wednesday 11/9 or Thursday 11/10 for incision check   Contact information:   [address] [place] [address] [phone]      Postpartum contraception: Depo Provera Rhogam Given postpartum: yes Rubella vaccine given postpartum: no Varicella vaccine given postpartum: no TDaP given  antepartum or postpartum: yes Flu vaccine given antepartum or postpartum: yes  Newborn Data:   Teila, Skalsky [[id number]]  Live born female  Birth Weight: 7 lb 12.5 oz (3530 g) APGAR: 9, 9   Louetta, [place] [[id number]]  Live born unspecified sex  Birth Weight:   APGAR: ,    Baby Feeding: Formula and breast  Disposition:home with mother  Thomasene Mohair, MD 03/19/2015 1:00 PM

## 2015-03-16 NOTE — H&P (Signed)
OB History & Physical   History of Present Illness:  Chief Complaint: Regular uterine contractions  HPI:  Lum BabeKiair J Ditter is a 24 y.o. [phone] female at 6636w1d dated by LMP c/w 10 week u/s.  Her pregnancy has been complicated by STD (trichomonas early in pregnancy), marijuana use, rh negative status, herpes labialis.    She reports contractions since about midnight this morning.   She denies leakage of fluid.   She denies vginal bleeding.   She reports fetal movement.    Maternal Medical History:   Past Medical History  Diagnosis Date  . Anemia   . Herpes labialis     Past Surgical History  Procedure Laterality Date  . Therapeutic abortion  2006, 2010, 2013   Allergies: No Known Allergies  Prior to Admission medications   Medication Sig Start Date End Date Taking? Authorizing Provider  amoxicillin (AMOXIL) 500 MG capsule Take 1 capsule (500 mg total) by mouth 2 (two) times daily. Patient not taking: Reported on 02/27/2015 11/07/14   Renford DillsLindsey Miller, NP  Prenatal Vit-Fe Fumarate-FA (PRENATAL MULTIVITAMIN) TABS tablet Take 1 tablet by mouth daily at 12 noon.    Historical Provider, MD  valACYclovir (VALTREX) 1000 MG tablet Take 1,000 mg by mouth daily.    Historical Provider, MD    OB History  Gravida Para Term Preterm AB SAB TAB Ectopic Multiple Living  6 2 2  3     2     # Outcome Date GA Lbr Len/2nd Weight Sex Delivery Anes PTL Lv  6 Current           5 AB 04/30/12          4 Term 09/12/10   8 lb 6 oz (3.799 kg) F Vag-Spont   Y  3 AB 01/29/09 3837w0d         2 Term 09/20/08 3068w0d  6 lb (2.722 kg) F    Y  1 AB 02/28/05              Prenatal care site: [facility]  Social History: She  reports that she quit smoking about 3 months ago. Her smoking use included Cigarettes. She quit after 5 years of use. She quit smokeless tobacco use about 3 months ago. She reports that she does not drink alcohol or use illicit drugs.  Family History: family history includes Hypertension  in her mother.   Review of Systems: Negative x 10 systems reviewed except as noted in the HPI.    Physical Exam:  Vital Signs: Afebrile, normotensive, other vitals stable. General: no acute distress.  HEENT: normocephalic, atraumatic Heart: regular rate & rhythm.  No murmurs/rubs/gallops Lungs: clear to auscultation bilaterally Abdomen: soft, gravid, non-tender;  EFW: 7 pounds Pelvic:   External: Normal external female genitalia  SSE: no lesions seen internally and externally  Cervix: 6cm per RN Extremities: non-tender, symmetric, no edema bilaterally.  DTRs: 2+  Neurologic: Alert & oriented x 3.    Pertinent Results:  Prenatal Labs: Blood type/Rh A negative  Antibody screen negative  Rubella Varicella Immune Immune  RPR NR  HBsAg negative  HIV negative  GC negative  Chlamydia negative  Genetic screening Second trimester negative  1 hour GTT 130  3 hour GTT n/a  GBS positive   Baseline FHR: 120 beats/min   Variability: moderate   Accelerations: present   Decelerations: absent Contractions: present frequency: 3 q 10 Overall assessment: category 1  Assessment:  Lum BabeKiair J Towle is a 24 y.o. [phone] female  at [redacted]w[redacted]d with active labor.   Plan:  1. Admit to Labor & Delivery  2. CBC, T&S, Clrs, IVF 3. GBS positive.  Ampicillin 2g now 4. Fetwal well-being: reassuring overall   Conard Novak, MD 03/16/2015 2:57 AM

## 2015-03-16 NOTE — OR Nursing (Signed)
Stat c-section called at 0620. I was not able to interview patient. Patient was already in OR suite when I arrived. Verified that we had the correct patient as she was going to sleep.

## 2015-03-16 NOTE — Anesthesia Procedure Notes (Signed)
Epidural Patient location during procedure: OB Start time: 03/16/2015 4:05 AM End time: 03/16/2015 4:36 AM  Staffing Performed by: anesthesiologist   Preanesthetic Checklist Completed: patient identified, site marked, surgical consent, pre-op evaluation, timeout performed, IV checked, risks and benefits discussed and monitors and equipment checked  Epidural Patient position: sitting Prep: Betadine Patient monitoring: heart rate, continuous pulse ox and blood pressure Approach: midline Location: L4-L5 Injection technique: LOR saline  Needle:  Needle type: Tuohy  Needle gauge: 18 G Needle length: 9 cm and 9 Needle insertion depth: 7 cm Catheter type: closed end flexible Catheter size: 20 Guage Catheter at skin depth: 9 cm Test dose: negative and 1.5% lidocaine with Epi 1:200 K  Assessment Events: blood not aspirated, injection not painful, no injection resistance, negative IV test and no paresthesia  Additional Notes   Patient tolerated the insertion well without complications.Reason for block:procedure for pain

## 2015-03-17 LAB — CBC
HEMATOCRIT: 27.2 % — AB (ref 35.0–47.0)
Hemoglobin: 8.6 g/dL — ABNORMAL LOW (ref 12.0–16.0)
MCH: 25.1 pg — ABNORMAL LOW (ref 26.0–34.0)
MCHC: 31.5 g/dL — AB (ref 32.0–36.0)
MCV: 79.4 fL — AB (ref 80.0–100.0)
Platelets: 291 10*3/uL (ref 150–440)
RBC: 3.42 MIL/uL — ABNORMAL LOW (ref 3.80–5.20)
RDW: 15.4 % — AB (ref 11.5–14.5)
WBC: 17.1 10*3/uL — ABNORMAL HIGH (ref 3.6–11.0)

## 2015-03-17 LAB — RPR: RPR Ser Ql: NONREACTIVE

## 2015-03-17 LAB — SURGICAL PATHOLOGY

## 2015-03-17 LAB — FETAL SCREEN: FETAL SCREEN: NEGATIVE

## 2015-03-17 MED ORDER — RHO D IMMUNE GLOBULIN 1500 UNIT/2ML IJ SOSY
300.0000 ug | PREFILLED_SYRINGE | Freq: Once | INTRAMUSCULAR | Status: AC
  Administered 2015-03-17: 300 ug via INTRAVENOUS
  Filled 2015-03-17: qty 2

## 2015-03-17 NOTE — Anesthesia Postprocedure Evaluation (Addendum)
  Anesthesia Post-op Note  Patient: Veronica Walters  Procedure(s) Performed: Procedure(s): CESAREAN SECTION (N/A)  Anesthesia type:No value filed.General  Patient location: 337  Post pain: Pain level controlled  Post assessment: Post-op Vital signs reviewed, Patient's Cardiovascular Status Stable, Respiratory Function Stable, Patent Airway and No signs of Nausea or vomiting  Post vital signs: Reviewed and stable  Last Vitals:  Filed Vitals:   03/17/15 0423  BP: 115/70  Pulse: 72  Temp: 36.8 C  Resp: 18    Level of consciousness: awake, alert  and patient cooperative  Complications: No apparent anesthesia complications

## 2015-03-17 NOTE — Anesthesia Post-op Follow-up Note (Signed)
  Anesthesia Pain Follow-up Note  Patient: Lum BabeKiair J Alesi  Day #: 1  Date of Follow-up: 03/17/2015 Time: 7:52 AM  Last Vitals:  Filed Vitals:   03/17/15 0423  BP: 115/70  Pulse: 72  Temp: 36.8 C  Resp: 18    Level of Consciousness: alert  Pain: none   Side Effects:None  Catheter Site Exam:clean  Plan: D/C from anesthesia care  IACONE, RON

## 2015-03-17 NOTE — Progress Notes (Signed)
Admit Date: 03/16/2015 Today's Date: 03/17/2015  Subjective: Postpartum Day 1: Cesarean Delivery Patient reports incisional pain, tolerating PO and no problems voiding.    Objective: Vital signs in last 24 hours: Temp:  [97.9 F (36.6 C)-98.4 F (36.9 C)] 98.1 F (36.7 C) (11/04 0759) Pulse Rate:  [71-97] 71 (11/04 0759) Resp:  [16-22] 17 (11/04 0859) BP: (113-124)/(67-83) 113/67 mmHg (11/04 0759) SpO2:  [96 %-100 %] 100 % (11/04 0859)  Physical Exam:  General: alert, cooperative and no distress Lochia: appropriate Uterine Fundus: firm Incision: healing well DVT Evaluation: No evidence of DVT seen on physical exam.   Recent Labs  03/16/15 0303 03/17/15 0536  HGB 10.2* 8.6*  HCT 32.3* 27.2*    Assessment/Plan: Status post Cesarean section. Doing well postoperatively.  Continue current care. Bottle feeding Plans OCPs Staples need out next week at office Monitor anemia  Merian Wroe PAUL 03/17/2015, 10:06 AM

## 2015-03-18 MED ORDER — IBUPROFEN 600 MG PO TABS
600.0000 mg | ORAL_TABLET | Freq: Four times a day (QID) | ORAL | Status: DC
  Administered 2015-03-18 – 2015-03-19 (×5): 600 mg via ORAL
  Filled 2015-03-18 (×5): qty 1

## 2015-03-18 NOTE — Progress Notes (Signed)
Patient ID: Veronica Walters, female   DOB: 06/20/1990, 24 y.o.   MRN: [id number] Admit Date: 03/16/2015 Today's Date: 03/18/2015  Subjective: Postpartum Day 2: Cesarean Delivery Patient reports incisional pain, tolerating PO and no problems voiding, ambulating well.    Objective: Vital signs in last 24 hours: Temp:  [97.9 F (36.6 C)-98.7 F (37.1 C)] 98.1 F (36.7 C) (11/05 0826) Pulse Rate:  [64-84] 64 (11/05 0826) Resp:  [16-20] 18 (11/05 0826) BP: (110-123)/(63-74) 115/70 mmHg (11/05 0826) SpO2:  [100 %] 100 % (11/05 0826)  Physical Exam:  General: alert, cooperative and no distress Lochia: appropriate Uterine Fundus: firm Incision: healing well, C/D/I DVT Evaluation: No evidence of DVT seen on physical exam.   Recent Labs  03/16/15 0303 03/17/15 0536  HGB 10.2* 8.6*  HCT 32.3* 27.2*    Assessment/Plan: Status post Cesarean section. Doing well postoperatively.  Continue current care. Formula feeding Plans Depo prior to discharge Rh neg- Rhogam as infant is Rh positive Staples need out prior to discharge Monitor anemia - PO ferrous sulfate  Conard NovakJackson, Skarleth Delmonico D, MD 03/18/2015, 11:35 AM

## 2015-03-18 NOTE — Anesthesia Preprocedure Evaluation (Addendum)
Anesthesia Evaluation  Patient identified by MRN, date of birth, ID band Patient awake    Reviewed: Allergy & Precautions, H&P , NPO status , Patient's Chart, lab work & pertinent test results  Airway Mallampati: II  TM Distance: >3 FB Neck ROM: full    Dental no notable dental hx. (+) Teeth Intact   Pulmonary former smoker,    Pulmonary exam normal breath sounds clear to auscultation       Cardiovascular negative cardio ROS Normal cardiovascular exam Rhythm:regular Rate:Normal     Neuro/Psych negative neurological ROS  negative psych ROS   GI/Hepatic negative GI ROS, Neg liver ROS,   Endo/Other  negative endocrine ROS  Renal/GU negative Renal ROS  negative genitourinary   Musculoskeletal   Abdominal   Peds  Hematology negative hematology ROS (+)   Anesthesia Other Findings Past Medical History:   Anemia                                                       Herpes labialis                                             Past Surgical History:   THERAPEUTIC ABORTION                             2006, 201*     Reproductive/Obstetrics (+) Pregnancy                            Anesthesia Physical Anesthesia Plan  ASA: II  Anesthesia Plan: Epidural   Post-op Pain Management:    Induction:   Airway Management Planned:   Additional Equipment:   Intra-op Plan:   Post-operative Plan:   Informed Consent: I have reviewed the patients History and Physical, chart, labs and discussed the procedure including the risks, benefits and alternatives for the proposed anesthesia with the patient or authorized representative who has indicated his/her understanding and acceptance.   Dental Advisory Given  Plan Discussed with: Anesthesiologist, CRNA and Surgeon  Anesthesia Plan Comments:        Anesthesia Quick Evaluation

## 2015-03-19 MED ORDER — FERROUS SULFATE 325 (65 FE) MG PO TABS: 325.0000 mg | ORAL_TABLET | Freq: Two times a day (BID) | ORAL | Status: DC

## 2015-03-19 MED ORDER — OXYCODONE-ACETAMINOPHEN 5-325 MG PO TABS: 2.0000 | ORAL_TABLET | Freq: Four times a day (QID) | ORAL | Status: DC | PRN

## 2015-03-19 MED ORDER — IBUPROFEN 600 MG PO TABS
600.0000 mg | ORAL_TABLET | Freq: Four times a day (QID) | ORAL | Status: DC
Start: 2015-03-19 — End: 2016-07-04

## 2015-03-19 NOTE — Progress Notes (Signed)
Pt discharged at this time; RN reviewed discharge instruction prior to discharge; pt discharged via wheelchair escorted by RN and significant other; pt going home with significant other and new baby

## 2015-03-20 LAB — RHOGAM INJECTION: Unit division: 0

## 2015-03-21 ENCOUNTER — Encounter: Payer: Self-pay | Admitting: Obstetrics and Gynecology

## 2015-03-21 LAB — CORD BLOOD GAS (ARTERIAL)
Acid-base deficit: 7.5 mmol/L — ABNORMAL HIGH (ref 0.0–2.0)
Bicarbonate: 22.6 mEq/L (ref 21.0–28.0)
PCO2 CORD BLOOD: 68 mmHg — AB (ref 42.0–56.0)
PH CORD BLOOD: 7.13 — AB (ref 7.210–7.380)

## 2015-03-29 NOTE — Addendum Note (Signed)
Addendum  created 03/29/15 1402 by Stormy FabianLinda Daizee Firmin, CRNA   Modules edited: Notes Section   Notes Section:  File: [id number]

## 2015-03-30 NOTE — Addendum Note (Signed)
Addendum  created 03/30/15 [id number] by Lenard SimmerAndrew Alexxis Mackert, MD   Modules edited: Anesthesia Responsible Staff, Notes Section   Notes Section:  File: [id number]

## 2015-03-31 ENCOUNTER — Encounter (HOSPITAL_COMMUNITY): Payer: Self-pay

## 2016-05-15 ENCOUNTER — Emergency Department: Payer: Medicaid Other

## 2016-05-15 ENCOUNTER — Emergency Department
Admission: EM | Admit: 2016-05-15 | Discharge: 2016-05-15 | Disposition: A | Discharge status: Home / Self Care | Payer: Medicaid Other | Attending: Emergency Medicine | Admitting: Emergency Medicine

## 2016-05-15 ENCOUNTER — Encounter: Payer: Self-pay | Admitting: Emergency Medicine

## 2016-05-15 DIAGNOSIS — Z87891 Personal history of nicotine dependence: Secondary | ICD-10-CM | POA: Insufficient documentation

## 2016-05-15 DIAGNOSIS — O23591 Infection of other part of genital tract in pregnancy, first trimester: Secondary | ICD-10-CM | POA: Insufficient documentation

## 2016-05-15 DIAGNOSIS — R1032 Left lower quadrant pain: Secondary | ICD-10-CM | POA: Diagnosis present

## 2016-05-15 DIAGNOSIS — N760 Acute vaginitis: Secondary | ICD-10-CM

## 2016-05-15 DIAGNOSIS — Z3A01 Less than 8 weeks gestation of pregnancy: Secondary | ICD-10-CM | POA: Diagnosis not present

## 2016-05-15 DIAGNOSIS — R109 Unspecified abdominal pain: Secondary | ICD-10-CM

## 2016-05-15 DIAGNOSIS — Z791 Long term (current) use of non-steroidal anti-inflammatories (NSAID): Secondary | ICD-10-CM | POA: Insufficient documentation

## 2016-05-15 DIAGNOSIS — B9689 Other specified bacterial agents as the cause of diseases classified elsewhere: Secondary | ICD-10-CM

## 2016-05-15 DIAGNOSIS — O26891 Other specified pregnancy related conditions, first trimester: Secondary | ICD-10-CM

## 2016-05-15 LAB — LIPASE, BLOOD: Lipase: 18 U/L (ref 11–51)

## 2016-05-15 LAB — URINALYSIS, COMPLETE (UACMP) WITH MICROSCOPIC
BACTERIA UA: NONE SEEN
Bilirubin Urine: NEGATIVE
Glucose, UA: NEGATIVE mg/dL
Hgb urine dipstick: NEGATIVE
Ketones, ur: NEGATIVE mg/dL
Leukocytes, UA: NEGATIVE
NITRITE: NEGATIVE
Protein, ur: NEGATIVE mg/dL
SPECIFIC GRAVITY, URINE: 1.023 (ref 1.005–1.030)
pH: 6 (ref 5.0–8.0)

## 2016-05-15 LAB — COMPREHENSIVE METABOLIC PANEL
ALBUMIN: 4.3 g/dL (ref 3.5–5.0)
ALK PHOS: 60 U/L (ref 38–126)
ALT: 11 U/L — ABNORMAL LOW (ref 14–54)
AST: 17 U/L (ref 15–41)
Anion gap: 6 (ref 5–15)
BILIRUBIN TOTAL: 0.3 mg/dL (ref 0.3–1.2)
BUN: 8 mg/dL (ref 6–20)
CO2: 23 mmol/L (ref 22–32)
Calcium: 9.4 mg/dL (ref 8.9–10.3)
Chloride: 106 mmol/L (ref 101–111)
Creatinine, Ser: 0.48 mg/dL (ref 0.44–1.00)
GFR calc Af Amer: >60 mL/min (ref >60)
GFR calc non Af Amer: >60 mL/min (ref >60)
GLUCOSE: 88 mg/dL (ref 65–99)
POTASSIUM: 3.8 mmol/L (ref 3.5–5.1)
Sodium: 135 mmol/L (ref 135–145)
TOTAL PROTEIN: 7.5 g/dL (ref 6.5–8.1)

## 2016-05-15 LAB — WET PREP, GENITAL
TRICH WET PREP: NONE SEEN
YEAST WET PREP: NONE SEEN

## 2016-05-15 LAB — CBC
HEMATOCRIT: 33.4 % — AB (ref 35.0–47.0)
Hemoglobin: 11 g/dL — ABNORMAL LOW (ref 12.0–16.0)
MCH: 26.8 pg (ref 26.0–34.0)
MCHC: 32.9 g/dL (ref 32.0–36.0)
MCV: 81.5 fL (ref 80.0–100.0)
Platelets: 312 10*3/uL (ref 150–440)
RBC: 4.1 MIL/uL (ref 3.80–5.20)
RDW: 15.2 % — AB (ref 11.5–14.5)
WBC: 7.7 10*3/uL (ref 3.6–11.0)

## 2016-05-15 LAB — HCG, QUANTITATIVE, PREGNANCY: HCG, BETA CHAIN, QUANT, S: 121364 m[IU]/mL — AB (ref <5)

## 2016-05-15 LAB — POCT PREGNANCY, URINE: PREG TEST UR: POSITIVE — AB

## 2016-05-15 LAB — CHLAMYDIA/NGC RT PCR (ARMC ONLY)
Chlamydia Tr: NOT DETECTED
N gonorrhoeae: NOT DETECTED

## 2016-05-15 MED ORDER — METRONIDAZOLE 500 MG PO TABS: 500.0000 mg | ORAL_TABLET | Freq: Two times a day (BID) | ORAL | 0 refills | Status: AC

## 2016-05-15 NOTE — ED Notes (Signed)
EDP at bedside  

## 2016-05-15 NOTE — Discharge Instructions (Signed)
Follow-up with OB/GYN for repeat ultrasound in a week. Return to the emergency room if you have vaginal bleeding. Take prenatal vitamins. Take antibiotics as prescribed for bacterial vaginosis. Do not drink alcohol with this antibiotic as it may cause an allergic reaction.

## 2016-05-15 NOTE — ED Triage Notes (Signed)
Pt c/o bilateral lower abdominal pain. Has had nausea and vomiting. Increased urinary frequency. No distress noted.

## 2016-05-15 NOTE — ED Provider Notes (Signed)
[facility] Emergency Department Provider Note  ____________________________________________  Time seen: Approximately 12:50 PM  I have reviewed the triage vital signs and the nursing notes.   HISTORY  Chief Complaint Abdominal Pain   HPI Veronica Walters is a 26 y.o. female G4P3 LMP 03/21/16 presents for evaluation of left-sided abdominal pain. Patient reports that yesterday she was feeling dizzy and nauseous. She reports that she almost passed out. She complains for the last 2-3 days of urinary frequency and left flank/left lower quadrant abdominal pain. At this time she reports that the pain is mild 1/10. The pain is intermittent and cramping located in the L flank and LLQ. She denies dysuria, vaginal bleeding. She has had vaginal discharge. No fever, chills, diarrhea. No CP or SOB. She reports that the pain fells similar to a prior episode of pyelonephritis.   Past Medical History:  Diagnosis Date  . Anemia   . Herpes labialis     Patient Active Problem List   Diagnosis Date Noted  . Placenta abruption, delivered, current hospitalization 03/16/2015  . Labor and delivery, indication for care 03/10/2015  . Decreased fetal movement 03/09/2015  . Low back pain during pregnancy 03/06/2015  . Irregular contractions 03/06/2015  . Abdominal pain 03/02/2015  . Abdominal pain in pregnancy 11/29/2014    Past Surgical History:  Procedure Laterality Date  . CESAREAN SECTION N/A 03/16/2015   Procedure: CESAREAN SECTION;  Surgeon: Conard Novak, MD;  Location: [facility] ORS;  Service: Obstetrics;  Laterality: N/A;  . THERAPEUTIC ABORTION  2006, 2010, 2013    Prior to Admission medications   Medication Sig Start Date End Date Taking? Authorizing Provider  ferrous sulfate 325 (65 FE) MG tablet Take 1 tablet (325 mg total) by mouth 2 (two) times daily with a meal. 03/19/15 04/02/15  Conard Novak, MD  ibuprofen (ADVIL,MOTRIN) 600 MG tablet Take 1 tablet (600 mg  total) by mouth every 6 (six) hours. 03/19/15   Conard Novak, MD  metroNIDAZOLE (FLAGYL) 500 MG tablet Take 1 tablet (500 mg total) by mouth 2 (two) times daily. 05/15/16 05/22/16  Nita Sickle, MD  oxyCODONE-acetaminophen (ROXICET) 5-325 MG tablet Take 2 tablets by mouth every 6 (six) hours as needed for moderate pain or severe pain. 03/19/15   Conard Novak, MD  Prenatal Vit-Fe Fumarate-FA (PRENATAL MULTIVITAMIN) TABS tablet Take 1 tablet by mouth daily at 12 noon.    Historical Provider, MD    Allergies Patient has no known allergies.  Family History  Problem Relation Age of Onset  . Hypertension Mother     Social History Social History  Substance Use Topics  . Smoking status: Former Smoker    Years: 5.00    Types: Cigarettes    Quit date: 11/29/2014  . Smokeless tobacco: Former Neurosurgeon    Quit date: 11/29/2014  . Alcohol use No    Review of Systems  Constitutional: Negative for fever. + dizziness Eyes: Negative for visual changes. ENT: Negative for sore throat. Neck: No neck pain  Cardiovascular: Negative for chest pain. Respiratory: Negative for shortness of breath. Gastrointestinal: + LLQ abdominal pain and vomiting. No diarrhea. Genitourinary: Negative for dysuria. + frequency and vaginal discharge Musculoskeletal: Negative for back pain. Skin: Negative for rash. Neurological: Negative for headaches, weakness or numbness. Psych: No SI or HI  ____________________________________________   PHYSICAL EXAM:  VITAL SIGNS: ED Triage Vitals [05/15/16 0907]  Enc Vitals Group     BP 119/73     Pulse  Rate 69     Resp 18     Temp 97.7 F (36.5 C)     Temp Source Oral     SpO2 100 %     Weight 162 lb (73.5 kg)     Height 5\' 3"  (1.6 m)     Head Circumference      Peak Flow      Pain Score 6     Pain Loc      Pain Edu?      Excl. in GC?     Constitutional: Alert and oriented. Well appearing and in no apparent distress. HEENT:      Head: Normocephalic  and atraumatic.         Eyes: Conjunctivae are normal. Sclera is non-icteric. EOMI. PERRL      Mouth/Throat: Mucous membranes are moist.       Neck: Supple with no signs of meningismus. Cardiovascular: Regular rate and rhythm. No murmurs, gallops, or rubs. 2+ symmetrical distal pulses are present in all extremities. No JVD. Respiratory: Normal respiratory effort. Lungs are clear to auscultation bilaterally. No wheezes, crackles, or rhonchi.  Gastrointestinal: Soft, ttp over the LLQ, and non distended with positive bowel sounds. No rebound or guarding. Pelvic exam: Normal external genitalia, no rashes or lesions. Watery white discharge. Os closed. No cervical motion tenderness.  No uterine tenderness. TTP over the L adnexa.   Musculoskeletal: Nontender with normal range of motion in all extremities. No edema, cyanosis, or erythema of extremities. Neurologic: Normal speech and language. Face is symmetric. Moving all extremities. No gross focal neurologic deficits are appreciated. Skin: Skin is warm, dry and intact. No rash noted. Psychiatric: Mood and affect are normal. Speech and behavior are normal.  ____________________________________________   LABS (all labs ordered are listed, but only abnormal results are displayed)  Labs Reviewed  WET PREP, GENITAL - Abnormal; Notable for the following:       Result Value   Clue Cells Wet Prep HPF POC PRESENT (*)    WBC, Wet Prep HPF POC MANY (*)    All other components within normal limits  COMPREHENSIVE METABOLIC PANEL - Abnormal; Notable for the following:    ALT 11 (*)    All other components within normal limits  CBC - Abnormal; Notable for the following:    Hemoglobin 11.0 (*)    HCT 33.4 (*)    RDW 15.2 (*)    All other components within normal limits  URINALYSIS, COMPLETE (UACMP) WITH MICROSCOPIC - Abnormal; Notable for the following:    Color, Urine YELLOW (*)    APPearance CLEAR (*)    Squamous Epithelial / LPF 6-30 (*)    All  other components within normal limits  HCG, QUANTITATIVE, PREGNANCY - Abnormal; Notable for the following:    hCG, Beta Chain, Quant, S 121,364 (*)    All other components within normal limits  POCT PREGNANCY, URINE - Abnormal; Notable for the following:    Preg Test, Ur POSITIVE (*)    All other components within normal limits  CHLAMYDIA/NGC RT PCR ([facility] ONLY)  LIPASE, BLOOD  POC URINE PREG, ED   ____________________________________________  EKG  none ____________________________________________  RADIOLOGY  TVUS: Single live intrauterine gestation with estimated gestational age of 6+ weeks. Debris is noted within the gestational sac. There may well have been hemorrhage within the gestational sac. This finding warrants close clinical and imaging surveillance. There is a small subchorionic hemorrhage. Trace free pelvic fluid may be physiologic. ____________________________________________  PROCEDURES  Procedure(s) performed: None Procedures Critical Care performed:  None ____________________________________________   INITIAL IMPRESSION / ASSESSMENT AND PLAN / ED COURSE   26 y.o. female G4P3 LMP 03/21/16 presents for evaluation of left-sided abdominal pain. Patient found to be pregnant in the emergency room. Pelvic exam showing left adnexal tenderness. Patient is hemodynamically stable with no vaginal bleeding. Transvaginal ultrasound has been ordered to rule out ectopic pregnancy or tubo-ovarian abscess. Wet prep, gonorrhea and chlamydia have been sent to the lab. UA with no evidence of urinary tract infection. CBC and CMP with no acute findings.  Clinical Course as of May 15 1440  Wed May 15, 2016  1434 US showing Single live intrauterine gestation with estimated gestational age of 6+ weeks. Debris is noted within the gestational sac. There may well have been hemorrhage within the  gestational sac. This finding warrants close clinical and imaging surveillance.  There is a small subchorionic hemorrhage. Trace free pelvic fluid may be physiologic.  Patient is A negative blood type. She has no external vaginal bleeding however since US showed subchorionic hemorrhage and possible gestational sac hemorrhage I discussed those findings with Dr. Tiburcio PeaHarris, OB/GYN who recommended against program at this time. They will follow-up her closely within a week in the clinic. Patient be discharged home at this time.  [CV]    Clinical Course User Index [CV] Nita Sicklearolina Eldean Nanna, MD    Pertinent labs & imaging results that were available during my care of the patient were reviewed by me and considered in my medical decision making (see chart for details).    ____________________________________________   FINAL CLINICAL IMPRESSION(S) / ED DIAGNOSES  Final diagnoses:  Abdominal pain during pregnancy in first trimester  BV (bacterial vaginosis)      NEW MEDICATIONS STARTED DURING THIS VISIT:  New Prescriptions   METRONIDAZOLE (FLAGYL) 500 MG TABLET    Take 1 tablet (500 mg total) by mouth 2 (two) times daily.     Note:  This document was prepared using Dragon voice recognition software and may include unintentional dictation errors.    Nita Sicklearolina Jeanmarie Mccowen, MD 05/15/16 [phone]

## 2016-05-18 ENCOUNTER — Emergency Department
Admission: EM | Admit: 2016-05-18 | Discharge: 2016-05-18 | Disposition: A | Discharge status: Home / Self Care | Payer: Medicaid Other | Attending: Emergency Medicine | Admitting: Emergency Medicine

## 2016-05-18 ENCOUNTER — Encounter: Payer: Self-pay | Admitting: Emergency Medicine

## 2016-05-18 DIAGNOSIS — W503XXA Accidental bite by another person, initial encounter: Secondary | ICD-10-CM

## 2016-05-18 DIAGNOSIS — S61259A Open bite of unspecified finger without damage to nail, initial encounter: Secondary | ICD-10-CM

## 2016-05-18 DIAGNOSIS — Y929 Unspecified place or not applicable: Secondary | ICD-10-CM | POA: Diagnosis not present

## 2016-05-18 DIAGNOSIS — Y9389 Activity, other specified: Secondary | ICD-10-CM | POA: Diagnosis not present

## 2016-05-18 DIAGNOSIS — Z87891 Personal history of nicotine dependence: Secondary | ICD-10-CM | POA: Insufficient documentation

## 2016-05-18 DIAGNOSIS — S60412A Abrasion of right middle finger, initial encounter: Secondary | ICD-10-CM | POA: Diagnosis not present

## 2016-05-18 DIAGNOSIS — Z79899 Other long term (current) drug therapy: Secondary | ICD-10-CM | POA: Insufficient documentation

## 2016-05-18 DIAGNOSIS — S6991XA Unspecified injury of right wrist, hand and finger(s), initial encounter: Secondary | ICD-10-CM | POA: Diagnosis present

## 2016-05-18 DIAGNOSIS — Y999 Unspecified external cause status: Secondary | ICD-10-CM | POA: Insufficient documentation

## 2016-05-18 MED ORDER — AMOXICILLIN-POT CLAVULANATE 875-125 MG PO TABS
1.0000 | ORAL_TABLET | Freq: Two times a day (BID) | ORAL | 0 refills | Status: AC
Start: 2016-05-18 — End: 2016-05-28

## 2016-05-18 NOTE — Discharge Instructions (Signed)
Please return to the emergency department for any increased pain swelling and redness or limited range of motion of the right third digit. Take antibiotics as prescribed.
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
## 2016-07-02 ENCOUNTER — Emergency Department: Payer: Medicaid Other

## 2016-07-02 ENCOUNTER — Emergency Department
Admission: EM | Admit: 2016-07-02 | Discharge: 2016-07-02 | Disposition: A | Discharge status: Home / Self Care | Payer: Medicaid Other | Attending: Emergency Medicine | Admitting: Emergency Medicine

## 2016-07-02 DIAGNOSIS — Z3A14 14 weeks gestation of pregnancy: Secondary | ICD-10-CM | POA: Diagnosis not present

## 2016-07-02 DIAGNOSIS — Z2913 Encounter for prophylactic Rho(D) immune globulin: Secondary | ICD-10-CM | POA: Insufficient documentation

## 2016-07-02 DIAGNOSIS — O4402 Placenta previa specified as without hemorrhage, second trimester: Secondary | ICD-10-CM | POA: Diagnosis not present

## 2016-07-02 DIAGNOSIS — O200 Threatened abortion: Secondary | ICD-10-CM

## 2016-07-02 DIAGNOSIS — Z87891 Personal history of nicotine dependence: Secondary | ICD-10-CM | POA: Diagnosis not present

## 2016-07-02 DIAGNOSIS — O209 Hemorrhage in early pregnancy, unspecified: Secondary | ICD-10-CM | POA: Diagnosis present

## 2016-07-02 DIAGNOSIS — N939 Abnormal uterine and vaginal bleeding, unspecified: Secondary | ICD-10-CM

## 2016-07-02 LAB — ANTIBODY SCREEN: Antibody Screen: NEGATIVE

## 2016-07-02 LAB — HCG, QUANTITATIVE, PREGNANCY: hCG, Beta Chain, Quant, S: 44892 m[IU]/mL — ABNORMAL HIGH (ref <5)

## 2016-07-02 LAB — ABO/RH: ABO/RH(D): A NEG

## 2016-07-02 MED ORDER — RHO D IMMUNE GLOBULIN 1500 UNIT/2ML IJ SOSY
300.0000 ug | PREFILLED_SYRINGE | Freq: Once | INTRAMUSCULAR | Status: AC
  Administered 2016-07-02: 300 ug via INTRAMUSCULAR
  Filled 2016-07-02: qty 2

## 2016-07-02 NOTE — ED Triage Notes (Signed)
Pt states she is [redacted] weeks pregnant and is having abd pain/pressure since last night, this morning having some spotting.. States she goes to the health department for prenatal care.

## 2016-07-02 NOTE — Discharge Instructions (Addendum)
We believe that your symptoms today are caused by a condition called placenta previa.  Please read through the included information, but as we discussed, this is a situation that frequently resolves but you definitely need to follow up with an OB/GYN provider for further evaluation and discussion about how to proceed with your pregnancy.  If he develop any new or worsening symptoms that concern you, including but not limited to increased bleeding, fever/chills, worsening abdominal pain, please return immediately to the emergency department.  As a result of your blood type, you did  receive an injection of medication called Rhogam - please let your OB/Gyn know.

## 2016-07-02 NOTE — ED Provider Notes (Signed)
[facility] Emergency Department Provider Note  ____________________________________________   None    (approximate)  I have reviewed the triage vital signs and the nursing notes.   HISTORY  Chief Complaint Abdominal Pain    HPI Veronica Walters is a 26 y.o. female G7 P3 Ab3 (electives) at approximately [redacted] weeks gestation who goes to the health department for prenatal care who presents for evaluation of gradually worsening right sided pelvic pain and pressure for the last several days.  She also had some light spotting last night and then again this morning.  She states that movement and "everything" make the pain worse and rest makes it a little bit better.  She reports that when she was diagnosed with her pregnancy about a month ago she was told that she had "some bleeding around the uterus" and that she needed to follow-up for another ultrasound but she has not done so.She reports that she has gotten RhoGAM in the past and that she had bleeding with each one of her prior pregnancies, sometimes severe, but this is only minor spotting since yesterday.  She denies lightheadedness, dizziness, fever/chills, chest pain, shortness of breath, nausea, vomiting, dysuria.   Past Medical History:  Diagnosis Date  . Anemia   . Herpes labialis     Patient Active Problem List   Diagnosis Date Noted  . Placenta abruption, delivered, current hospitalization 03/16/2015  . Labor and delivery, indication for care 03/10/2015  . Decreased fetal movement 03/09/2015  . Low back pain during pregnancy 03/06/2015  . Irregular contractions 03/06/2015  . Abdominal pain 03/02/2015  . Abdominal pain in pregnancy 11/29/2014    Past Surgical History:  Procedure Laterality Date  . CESAREAN SECTION N/A 03/16/2015   Procedure: CESAREAN SECTION;  Surgeon: Conard Novak, MD;  Location: [facility] ORS;  Service: Obstetrics;  Laterality: N/A;  . THERAPEUTIC ABORTION  2006, 2010, 2013     Prior to Admission medications   Medication Sig Start Date End Date Taking? Authorizing Provider  ferrous sulfate 325 (65 FE) MG tablet Take 1 tablet (325 mg total) by mouth 2 (two) times daily with a meal. 03/19/15 04/02/15  Conard Novak, MD  ibuprofen (ADVIL,MOTRIN) 600 MG tablet Take 1 tablet (600 mg total) by mouth every 6 (six) hours. 03/19/15   Conard Novak, MD  oxyCODONE-acetaminophen (ROXICET) 5-325 MG tablet Take 2 tablets by mouth every 6 (six) hours as needed for moderate pain or severe pain. 03/19/15   Conard Novak, MD  Prenatal Vit-Fe Fumarate-FA (PRENATAL MULTIVITAMIN) TABS tablet Take 1 tablet by mouth daily at 12 noon.    Historical Provider, MD    Allergies Patient has no known allergies.  Family History  Problem Relation Age of Onset  . Hypertension Mother     Social History Social History  Substance Use Topics  . Smoking status: Former Smoker    Years: 5.00    Types: Cigarettes    Quit date: 11/29/2014  . Smokeless tobacco: Former Neurosurgeon    Quit date: 11/29/2014  . Alcohol use No    Review of Systems Constitutional: No fever/chills Eyes: No visual changes. ENT: No sore throat. Cardiovascular: Denies chest pain. Respiratory: Denies shortness of breath. Gastrointestinal: Right-sided pelvic pain.  No nausea, no vomiting.  No diarrhea.  No constipation. Genitourinary: Light intermittent vaginal spotting since yesterday . Negative for dysuria.  Musculoskeletal: Negative for back pain. Skin: Negative for rash. Neurological: Negative for headaches, focal weakness or numbness.  10-point ROS  otherwise negative.  ____________________________________________   PHYSICAL EXAM:  VITAL SIGNS: ED Triage Vitals  Enc Vitals Group     BP 07/02/16 1038 129/67     Pulse Rate 07/02/16 1038 68     Resp 07/02/16 1038 18     Temp 07/02/16 1038 98.2 F (36.8 C)     Temp Source 07/02/16 1038 Oral     SpO2 07/02/16 1038 100 %     Weight --      Height --       Head Circumference --      Peak Flow --      Pain Score 07/02/16 1029 8     Pain Loc --      Pain Edu? --      Excl. in GC? --     Constitutional: Alert and oriented. Well appearing and in no acute distress. Eyes: Conjunctivae are normal. PERRL. EOMI. Head: Atraumatic. Nose: No congestion/rhinnorhea. Mouth/Throat: Mucous membranes are moist. Neck: No stridor.  No meningeal signs.   Cardiovascular: Normal rate, regular rhythm. Good peripheral circulation. Grossly normal heart sounds. Respiratory: Normal respiratory effort.  No retractions. Lungs CTAB. Gastrointestinal: Soft with mild generalized tenderness to palpation throughout with no specific focal tenderness GU:  deferred given early second trimester spotting Musculoskeletal: No lower extremity tenderness nor edema. No gross deformities of extremities. Neurologic:  Normal speech and language. No gross focal neurologic deficits are appreciated.  Skin:  Skin is warm, dry and intact. No rash noted. Psychiatric: Mood and affect are normal. Speech and behavior are normal.  ____________________________________________   LABS (all labs ordered are listed, but only abnormal results are displayed)  Labs Reviewed  HCG, QUANTITATIVE, PREGNANCY - Abnormal; Notable for the following:       Result Value   hCG, Beta Chain, Quant, S [id number] (*)    All other components within normal limits  ABO/RH  ANTIBODY SCREEN  RHOGAM INJECTION  RH IG WORKUP (INCLUDES ABO/RH)   ____________________________________________  EKG  None - EKG not ordered by ED physician ____________________________________________  RADIOLOGY   [place] Ob Limited > 14 Wks  Result Date: 07/02/2016 CLINICAL DATA:  Vaginal bleeding. EXAM: LIMITED OBSTETRIC ULTRASOUND AND TRANSVAGINAL OBSTETRIC ULTRASOUND FINDINGS: Number of Fetuses: 1 Heart Rate:  152 bpm Movement: Present Presentation: Cephalic Placental Location: Anterior Previa: Complete Amniotic Fluid  (Subjective): Within normal limits.  AFI 2.9 cm. Crown-rump length 7.7 cm 13w  5d MATERNAL FINDINGS: Cervix:  Appears closed. Uterus/Adnexae:  No abnormality visualized. IMPRESSION: Complete anterior placenta previa. This exam is performed on an emergent basis and does not comprehensively evaluate fetal size, dating, or anatomy; follow-up complete OB US should be considered if further fetal assessment is warranted. Electronically Signed   By: Maisie Fushomas  Register   On: 07/02/2016 13:26    ____________________________________________   PROCEDURES  Procedure(s) performed:   Procedures   Critical Care performed: No ____________________________________________   INITIAL IMPRESSION / ASSESSMENT AND PLAN / ED COURSE  Pertinent labs & imaging results that were available during my care of the patient were reviewed by me and considered in my medical decision making (see chart for details).  12:37 PM:  RHOgam workup ordered, U/S ordered.  Patient stable and in NAD.   Clinical Course as of Jul 02 1433  Tue Jul 02, 2016  1349 Complete placenta previa.  I updated the patient and explained what it is, the importance of pelvic rest, and the need for outpatient follow-up.  She is awaiting her RhoGAM and then she  is stable for discharge and outpatient follow-up. I gave my usual and customary return precautions.  US OB Limited > 14 wks [CF]    Clinical Course User Index [CF] Loleta Rose, MD    ____________________________________________  FINAL CLINICAL IMPRESSION(S) / ED DIAGNOSES  Final diagnoses:  Vaginal bleeding  Complete placenta previa nos or without hemorrhage, second trimester  Threatened miscarriage in early pregnancy     MEDICATIONS GIVEN DURING THIS VISIT:  Medications  rho (d) immune globulin (RHIG/RHOPHYLAC) injection 300 mcg (not administered)     NEW OUTPATIENT MEDICATIONS STARTED DURING THIS VISIT:  New Prescriptions   No medications on file    Modified  Medications   No medications on file    Discontinued Medications   No medications on file     Note:  This document was prepared using Dragon voice recognition software and may include unintentional dictation errors.    Loleta Rose, MD 07/02/16 1435

## 2016-07-03 LAB — RHOGAM INJECTION: UNIT DIVISION: 0

## 2016-07-04 ENCOUNTER — Emergency Department
Admission: EM | Admit: 2016-07-04 | Discharge: 2016-07-04 | Disposition: A | Discharge status: Home / Self Care | Payer: Medicaid Other | Attending: Emergency Medicine | Admitting: Emergency Medicine

## 2016-07-04 DIAGNOSIS — J101 Influenza due to other identified influenza virus with other respiratory manifestations: Secondary | ICD-10-CM | POA: Insufficient documentation

## 2016-07-04 DIAGNOSIS — Z3A13 13 weeks gestation of pregnancy: Secondary | ICD-10-CM | POA: Diagnosis not present

## 2016-07-04 DIAGNOSIS — O99511 Diseases of the respiratory system complicating pregnancy, first trimester: Secondary | ICD-10-CM | POA: Diagnosis not present

## 2016-07-04 DIAGNOSIS — Z79899 Other long term (current) drug therapy: Secondary | ICD-10-CM | POA: Diagnosis not present

## 2016-07-04 DIAGNOSIS — Z87891 Personal history of nicotine dependence: Secondary | ICD-10-CM | POA: Insufficient documentation

## 2016-07-04 LAB — INFLUENZA PANEL BY PCR (TYPE A & B)
Influenza A By PCR: NEGATIVE
Influenza B By PCR: POSITIVE — AB

## 2016-07-04 LAB — POCT RAPID STREP A: Streptococcus, Group A Screen (Direct): NEGATIVE

## 2016-07-04 MED ORDER — OSELTAMIVIR PHOSPHATE 75 MG PO CAPS: 75.0000 mg | ORAL_CAPSULE | Freq: Two times a day (BID) | ORAL | 0 refills | Status: AC

## 2016-07-04 NOTE — ED Provider Notes (Signed)
[facility] Emergency Department Provider Note  ____________________________________________   First MD Initiated Contact with Patient 07/04/16 [phone]     (approximate)  I have reviewed the triage vital signs and the nursing notes.   HISTORY  Chief Complaint URI   HPI Veronica Walters is a 26 y.o. female is here with complaint of cough, chills, headache that started yesterday. Patient is unaware of whether or not she got a flu vaccine this year. She has not been takingover-the-counter medication as she states she is [redacted] weeks pregnant. Currently she goes to the health department for her prenatal care. She denies any nausea, vomiting or diarrhea. She is here with her 27-year-old daughter with similar symptoms. In talking with her sounds as if the symptoms that she is experiencing came on gradually but worsened quickly. Currently she rates her pain as a 0/10.   Past Medical History:  Diagnosis Date  . Anemia   . Herpes labialis     Patient Active Problem List   Diagnosis Date Noted  . Placenta abruption, delivered, current hospitalization 03/16/2015  . Labor and delivery, indication for care 03/10/2015  . Decreased fetal movement 03/09/2015  . Low back pain during pregnancy 03/06/2015  . Irregular contractions 03/06/2015  . Abdominal pain 03/02/2015  . Abdominal pain in pregnancy 11/29/2014    Past Surgical History:  Procedure Laterality Date  . CESAREAN SECTION N/A 03/16/2015   Procedure: CESAREAN SECTION;  Surgeon: Conard NovakStephen D Jackson, MD;  Location: [facility] ORS;  Service: Obstetrics;  Laterality: N/A;  . THERAPEUTIC ABORTION  2006, 2010, 2013    Prior to Admission medications   Medication Sig Start Date End Date Taking? Authorizing Provider  ferrous sulfate 325 (65 FE) MG tablet Take 1 tablet (325 mg total) by mouth 2 (two) times daily with a meal. 03/19/15 04/02/15  Conard NovakStephen D Jackson, MD  oseltamivir (TAMIFLU) 75 MG capsule Take 1 capsule (75 mg total)  by mouth 2 (two) times daily. 07/04/16 07/09/16  Tommi Rumpshonda L Summers, PA-C  Prenatal Vit-Fe Fumarate-FA (PRENATAL MULTIVITAMIN) TABS tablet Take 1 tablet by mouth daily at 12 noon.    Historical Provider, MD    Allergies Patient has no known allergies.  Family History  Problem Relation Age of Onset  . Hypertension Mother     Social History Social History  Substance Use Topics  . Smoking status: Former Smoker    Years: 5.00    Types: Cigarettes    Quit date: 11/29/2014  . Smokeless tobacco: Former NeurosurgeonUser    Quit date: 11/29/2014  . Alcohol use No    Review of Systems Constitutional: Subjective fever/chills Eyes: No visual changes. ENT: No sore throat.  Denies ear pain. Cardiovascular: Denies chest pain. Respiratory: Denies shortness of breath. Positive for cough. Gastrointestinal: No abdominal pain.  No nausea, no vomiting.  No diarrhea.   Genitourinary: Negative for dysuria. Musculoskeletal: Negative for back pain. Skin: Negative for rash. Neurological: Negative for headaches, focal weakness or numbness.  10-point ROS otherwise negative.  ____________________________________________   PHYSICAL EXAM:  VITAL SIGNS: ED Triage Vitals  Enc Vitals Group     BP 07/04/16 0851 113/66     Pulse Rate 07/04/16 0851 85     Resp 07/04/16 0851 16     Temp 07/04/16 0851 98.7 F (37.1 C)     Temp Source 07/04/16 0851 Oral     SpO2 07/04/16 0851 100 %     Weight 07/04/16 0846 162 lb (73.5 kg)  Height 07/04/16 0846 5\' 3"  (1.6 m)     Head Circumference --      Peak Flow --      Pain Score 07/04/16 0846 0     Pain Loc --      Pain Edu? --      Excl. in GC? --     Constitutional: Alert and oriented. Well appearing and in no acute distress. Eyes: Conjunctivae are normal. PERRL. EOMI. Head: Atraumatic. Nose: Minimal congestion/rhinnorhea. EACs and TMs are clear bilaterally. Mouth/Throat: Mucous membranes are moist.  Oropharynx non-erythematous. Neck: No stridor.     Hematological/Lymphatic/Immunilogical: No cervical lymphadenopathy. Cardiovascular: Normal rate, regular rhythm. Grossly normal heart sounds.  Good peripheral circulation. Respiratory: Normal respiratory effort.  No retractions. Lungs CTAB. Gastrointestinal: Soft and nontender. No distention. Sounds normoactive 4 quadrants. Musculoskeletal: Moves Upper and lower extremities without any difficulty. Normal gait was noted. Neurologic:  Normal speech and language. No gross focal neurologic deficits are appreciated. No gait instability. Skin:  Skin is warm, dry and intact. No rash noted. Psychiatric: Mood and affect are normal. Speech and behavior are normal.  ____________________________________________   LABS (all labs ordered are listed, but only abnormal results are displayed)  Labs Reviewed  INFLUENZA PANEL BY PCR (TYPE A & B) - Abnormal; Notable for the following:       Result Value   Influenza B By PCR POSITIVE (*)    All other components within normal limits  POCT RAPID STREP A    PROCEDURES  Procedure(s) performed: None  Procedures  Critical Care performed: No  ____________________________________________   INITIAL IMPRESSION / ASSESSMENT AND PLAN / ED COURSE  Pertinent labs & imaging results that were available during my care of the patient were reviewed by me and considered in my medical decision making (see chart for details).  She was made aware that she was positive for influenza B. Patient is to let her OB/GYN know the results of her flu test. Patient was made aware of Tamiflu being category C and possible side effects. Patient elected to take Tamiflu and wants prescription for daughter as well. To increase fluids and take Tylenol only if needed for headache, fever or body aches. Patient states she has an appointment with [facility] health Department today at 12:30. She will not be making her appointment time since she is in the emergency room.       ____________________________________________   FINAL CLINICAL IMPRESSION(S) / ED DIAGNOSES  Final diagnoses:  Influenza B      NEW MEDICATIONS STARTED DURING THIS VISIT:  Discharge Medication List as of 07/04/2016 12:39 PM    START taking these medications   Details  oseltamivir (TAMIFLU) 75 MG capsule Take 1 capsule (75 mg total) by mouth 2 (two) times daily., Starting Thu 07/04/2016, Until Tue 07/09/2016, Print         Note:  This document was prepared using Dragon voice recognition software and may include unintentional dictation errors.    Tommi Rumps, PA-C 07/04/16 1303    Jennye Moccasin, MD 07/04/16 [phone]

## 2016-07-04 NOTE — ED Triage Notes (Signed)
Pt c/o cough with congestion since yesterday 

## 2016-07-04 NOTE — Discharge Instructions (Signed)
[facility] your  [facility] to let them know that you are taking Tamiflu and that you're positive for influenza B. Increase fluids. Tylenol if needed for fever or body aches. You cannot take anti-inflammatories while being pregnant.

## 2016-07-04 NOTE — ED Notes (Signed)
Pt presents to ED with c/o cough, chills, and HA that started yesterday. Pt is alert and oriented at this time. NAD Noted at this time.

## 2016-07-23 ENCOUNTER — Emergency Department
Admission: EM | Admit: 2016-07-23 | Discharge: 2016-07-23 | Disposition: A | Discharge status: Home / Self Care | Payer: Medicaid Other | Attending: Emergency Medicine | Admitting: Emergency Medicine

## 2016-07-23 ENCOUNTER — Encounter: Payer: Self-pay | Admitting: Emergency Medicine

## 2016-07-23 ENCOUNTER — Emergency Department: Payer: Medicaid Other

## 2016-07-23 DIAGNOSIS — Z87891 Personal history of nicotine dependence: Secondary | ICD-10-CM | POA: Insufficient documentation

## 2016-07-23 DIAGNOSIS — Y929 Unspecified place or not applicable: Secondary | ICD-10-CM | POA: Diagnosis not present

## 2016-07-23 DIAGNOSIS — Y939 Activity, unspecified: Secondary | ICD-10-CM | POA: Diagnosis not present

## 2016-07-23 DIAGNOSIS — S0990XA Unspecified injury of head, initial encounter: Secondary | ICD-10-CM | POA: Diagnosis present

## 2016-07-23 DIAGNOSIS — Y999 Unspecified external cause status: Secondary | ICD-10-CM | POA: Insufficient documentation

## 2016-07-23 DIAGNOSIS — S0083XA Contusion of other part of head, initial encounter: Secondary | ICD-10-CM

## 2016-07-23 MED ORDER — IBUPROFEN 600 MG PO TABS
600.0000 mg | ORAL_TABLET | Freq: Once | ORAL | Status: AC
  Administered 2016-07-23: 600 mg via ORAL
  Filled 2016-07-23: qty 1

## 2016-07-23 MED ORDER — OXYCODONE-ACETAMINOPHEN 5-325 MG PO TABS
1.0000 | ORAL_TABLET | Freq: Once | ORAL | Status: AC
  Administered 2016-07-23: 1 via ORAL
  Filled 2016-07-23: qty 1

## 2016-07-23 MED ORDER — OXYCODONE-ACETAMINOPHEN 7.5-325 MG PO TABS: 1.0000 | ORAL_TABLET | ORAL | 0 refills | Status: DC | PRN

## 2016-07-23 MED ORDER — IBUPROFEN 600 MG PO TABS: 600.0000 mg | ORAL_TABLET | Freq: Four times a day (QID) | ORAL | 0 refills | Status: DC | PRN

## 2016-07-23 NOTE — ED Notes (Signed)
[place] [employer] in with pt   
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
## 2016-09-04 ENCOUNTER — Emergency Department
Admission: EM | Admit: 2016-09-04 | Discharge: 2016-09-04 | Disposition: A | Discharge status: Home / Self Care | Payer: Medicaid Other | Attending: Emergency Medicine | Admitting: Emergency Medicine

## 2016-09-04 ENCOUNTER — Encounter: Payer: Self-pay | Admitting: *Deleted

## 2016-09-04 ENCOUNTER — Emergency Department: Payer: Medicaid Other

## 2016-09-04 DIAGNOSIS — Y9389 Activity, other specified: Secondary | ICD-10-CM | POA: Insufficient documentation

## 2016-09-04 DIAGNOSIS — Y929 Unspecified place or not applicable: Secondary | ICD-10-CM | POA: Insufficient documentation

## 2016-09-04 DIAGNOSIS — S1093XA Contusion of unspecified part of neck, initial encounter: Secondary | ICD-10-CM | POA: Insufficient documentation

## 2016-09-04 DIAGNOSIS — Z79899 Other long term (current) drug therapy: Secondary | ICD-10-CM | POA: Insufficient documentation

## 2016-09-04 DIAGNOSIS — S199XXA Unspecified injury of neck, initial encounter: Secondary | ICD-10-CM | POA: Diagnosis present

## 2016-09-04 DIAGNOSIS — Z87891 Personal history of nicotine dependence: Secondary | ICD-10-CM | POA: Diagnosis not present

## 2016-09-04 DIAGNOSIS — Y990 Civilian activity done for income or pay: Secondary | ICD-10-CM | POA: Insufficient documentation

## 2016-09-04 MED ORDER — IBUPROFEN 600 MG PO TABS: 600.0000 mg | ORAL_TABLET | Freq: Three times a day (TID) | ORAL | 0 refills | Status: DC | PRN

## 2016-09-04 MED ORDER — IBUPROFEN 600 MG PO TABS
600.0000 mg | ORAL_TABLET | Freq: Once | ORAL | Status: AC
  Administered 2016-09-04: 600 mg via ORAL
  Filled 2016-09-04: qty 1
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
## 2016-09-04 NOTE — Discharge Instructions (Signed)
Follow-up with your primary care doctor at [facility] if any continued problems. Use ice for comfort. Ibuprofen 600 mg 3 times a day with food. You may return to work tomorrow.

## 2018-04-06 LAB — HM HIV SCREENING LAB: HM HIV Screening: NEGATIVE

## 2018-05-21 ENCOUNTER — Emergency Department
Admission: EM | Admit: 2018-05-21 | Discharge: 2018-05-21 | Disposition: A | Discharge status: Home / Self Care | Payer: Self-pay | Attending: Emergency Medicine | Admitting: Emergency Medicine

## 2018-05-21 ENCOUNTER — Other Ambulatory Visit: Payer: Self-pay

## 2018-05-21 DIAGNOSIS — J020 Streptococcal pharyngitis: Secondary | ICD-10-CM | POA: Insufficient documentation

## 2018-05-21 DIAGNOSIS — F17210 Nicotine dependence, cigarettes, uncomplicated: Secondary | ICD-10-CM | POA: Insufficient documentation

## 2018-05-21 DIAGNOSIS — H9203 Otalgia, bilateral: Secondary | ICD-10-CM | POA: Insufficient documentation

## 2018-05-21 LAB — GROUP A STREP BY PCR: Group A Strep by PCR: DETECTED — AB

## 2018-05-21 MED ORDER — LIDOCAINE VISCOUS HCL 2 % MT SOLN
15.0000 mL | Freq: Once | OROMUCOSAL | Status: AC
Start: 2018-05-21 — End: 2018-05-21
  Administered 2018-05-21: 15 mL via OROMUCOSAL
  Filled 2018-05-21: qty 15

## 2018-05-21 MED ORDER — PENICILLIN V POTASSIUM 500 MG PO TABS
500.0000 mg | ORAL_TABLET | Freq: Once | ORAL | Status: AC
  Administered 2018-05-21: 500 mg via ORAL
  Filled 2018-05-21: qty 1

## 2018-05-21 MED ORDER — PENICILLIN V POTASSIUM 500 MG PO TABS: 500.0000 mg | ORAL_TABLET | Freq: Three times a day (TID) | ORAL | 0 refills | Status: AC

## 2018-05-21 MED ORDER — LIDOCAINE VISCOUS HCL 2 % MT SOLN: 10.0000 mL | OROMUCOSAL | 0 refills | Status: DC | PRN

## 2018-05-21 NOTE — ED Provider Notes (Signed)
[facility] Emergency Department Provider Note  ____________________________________________  Time seen: Approximately 9:35 AM  I have reviewed the triage vital signs and the nursing notes.   HISTORY  Chief Complaint Sore Throat    HPI Veronica Walters is a 28 y.o. female presents emergency department for evaluation of sore throat and bilateral ear pain for 2 to 3 days.  Patient states that it is difficult to swallow due to pain.  Daughter has also been coming down with a cold.  Patient is unsure of fever.  No nasal congestion, cough, shortness of breath, abdominal pain.  Past Medical History:  Diagnosis Date  . Anemia   . Herpes labialis     Patient Active Problem List   Diagnosis Date Noted  . Placenta abruption, delivered, current hospitalization 03/16/2015  . Labor and delivery, indication for care 03/10/2015  . Decreased fetal movement 03/09/2015  . Low back pain during pregnancy 03/06/2015  . Irregular contractions 03/06/2015  . Abdominal pain 03/02/2015  . Abdominal pain in pregnancy 11/29/2014    Past Surgical History:  Procedure Laterality Date  . CESAREAN SECTION N/A 03/16/2015   Procedure: CESAREAN SECTION;  Surgeon: Conard Novak, MD;  Location: [facility] ORS;  Service: Obstetrics;  Laterality: N/A;  . THERAPEUTIC ABORTION  2006, 2010, 2013    Prior to Admission medications   Medication Sig Start Date End Date Taking? Authorizing Provider  ferrous sulfate 325 (65 FE) MG tablet Take 1 tablet (325 mg total) by mouth 2 (two) times daily with a meal. 03/19/15 04/02/15  Conard Novak, MD  ibuprofen (ADVIL,MOTRIN) 600 MG tablet Take 1 tablet (600 mg total) by mouth every 8 (eight) hours as needed. 09/04/16   Tommi Rumps, PA-C  lidocaine (XYLOCAINE) 2 % solution Use as directed 10 mLs in the mouth or throat as needed for mouth pain. 05/21/18   Enid Derry, PA-C  penicillin v potassium (VEETID) 500 MG tablet Take 1 tablet (500 mg total)  by mouth 3 (three) times daily for 10 days. 05/21/18 05/31/18  Enid Derry, PA-C    Allergies Patient has no known allergies.  Family History  Problem Relation Age of Onset  . Hypertension Mother     Social History Social History   Tobacco Use  . Smoking status: Current Some Day Smoker    Types: Cigarettes  . Smokeless tobacco: Former Neurosurgeon    Quit date: 11/29/2014  Substance Use Topics  . Alcohol use: No  . Drug use: No     Review of Systems  Constitutional: No chills Eyes: No visual changes. No discharge. ENT: Negative for congestion and rhinorrhea. Positive for sore throat. Cardiovascular: No chest pain. Respiratory: Negative for cough. No SOB. Gastrointestinal: No abdominal pain.  No nausea, no vomiting.  No diarrhea.  No constipation. Musculoskeletal: Negative for musculoskeletal pain. Skin: Negative for rash, abrasions, lacerations, ecchymosis.   ____________________________________________   PHYSICAL EXAM:  VITAL SIGNS: ED Triage Vitals  Enc Vitals Group     BP 05/21/18 0850 (!) 136/91     Pulse Rate 05/21/18 0850 88     Resp 05/21/18 0850 16     Temp 05/21/18 0850 98.5 F (36.9 C)     Temp Source 05/21/18 0850 Oral     SpO2 05/21/18 0850 100 %     Weight 05/21/18 0851 183 lb (83 kg)     Height 05/21/18 0851 5\' 3"  (1.6 m)     Head Circumference --      Peak  Flow --      Pain Score 05/21/18 0851 10     Pain Loc --      Pain Edu? --      Excl. in GC? --      Constitutional: Alert and oriented. Well appearing and in no acute distress. Eyes: Conjunctivae are normal. PERRL. EOMI. No discharge. Head: Atraumatic. ENT: No frontal and maxillary sinus tenderness.      Ears: Tympanic membranes pearly gray with good landmarks. No discharge.      Nose: No congestion/rhinnorhea.      Mouth/Throat: Mucous membranes are moist. Oropharynx erythematous. Tonsils not enlarged. No exudates. Uvula midline. Neck: No stridor.   Hematological/Lymphatic/Immunilogical:  No cervical lymphadenopathy. Cardiovascular: Normal rate, regular rhythm.  Good peripheral circulation. Respiratory: Normal respiratory effort without tachypnea or retractions. Lungs CTAB. Good air entry to the bases with no decreased or absent breath sounds. Gastrointestinal: Bowel sounds 4 quadrants. Soft and nontender to palpation. No guarding or rigidity. No palpable masses. No distention. Musculoskeletal: Full range of motion to all extremities. No gross deformities appreciated. Neurologic:  Normal speech and language. No gross focal neurologic deficits are appreciated.  Skin:  Skin is warm, dry and intact. No rash noted. Psychiatric: Mood and affect are normal. Speech and behavior are normal. Patient exhibits appropriate insight and judgement.   ____________________________________________   LABS (all labs ordered are listed, but only abnormal results are displayed)  Labs Reviewed  GROUP A STREP BY PCR - Abnormal; Notable for the following components:      Result Value   Group A Strep by PCR DETECTED (*)    All other components within normal limits   ____________________________________________  EKG   ____________________________________________  RADIOLOGY   No results found.  ____________________________________________    PROCEDURES  Procedure(s) performed:    Procedures    Medications  lidocaine (XYLOCAINE) 2 % viscous mouth solution 15 mL (15 mLs Mouth/Throat Given 05/21/18 1046)  penicillin v potassium (VEETID) tablet 500 mg (500 mg Oral Given 05/21/18 1046)     ____________________________________________   INITIAL IMPRESSION / ASSESSMENT AND PLAN / ED COURSE  Pertinent labs & imaging results that were available during my care of the patient were reviewed by me and considered in my medical decision making (see chart for details).  Review of the [place] CSRS was performed in accordance of the [facility] prior to dispensing any controlled drugs.      Patient's diagnosis is consistent with strep throat. Vital signs and exam are reassuring.  Strep throat test is positive.  Patient appears well and is staying well hydrated. Patient should alternate tylenol and ibuprofen for fever. Patient feels comfortable going home. Patient will be discharged home with prescriptions for penicillin and viscous lidocaine. Patient is to follow up with primary care as needed or otherwise directed. Patient is given ED precautions to return to the ED for any worsening or new symptoms.     ____________________________________________  FINAL CLINICAL IMPRESSION(S) / ED DIAGNOSES  Final diagnoses:  Strep throat      NEW MEDICATIONS STARTED DURING THIS VISIT:  ED Discharge Orders         Ordered    penicillin v potassium (VEETID) 500 MG tablet  3 times daily     05/21/18 1108    lidocaine (XYLOCAINE) 2 % solution  As needed     05/21/18 1108              This chart was dictated using voice recognition software/Dragon. Despite best  efforts to proofread, errors can occur which can change the meaning. Any change was purely unintentional.    Enid Derry, PA-C 05/21/18 1122    Emily Filbert, MD 05/21/18 1242

## 2018-05-21 NOTE — ED Notes (Signed)
See triage note  presents with a 2-3 day hx of sore throat and bilateral ear pain  Denies any fever  But states pain increases with swallowing

## 2018-05-21 NOTE — ED Triage Notes (Signed)
Pt c/o sore throat and bilat ear pain x2-3 days Denies cough, runny nose, congestion

## 2019-01-29 ENCOUNTER — Other Ambulatory Visit: Payer: Self-pay

## 2019-01-29 ENCOUNTER — Encounter: Payer: Self-pay | Admitting: Advanced Practice Midwife

## 2019-01-29 ENCOUNTER — Ambulatory Visit: Payer: Self-pay | Admitting: Advanced Practice Midwife

## 2019-01-29 ENCOUNTER — Ambulatory Visit: Payer: Self-pay

## 2019-01-29 DIAGNOSIS — Z113 Encounter for screening for infections with a predominantly sexual mode of transmission: Secondary | ICD-10-CM

## 2019-01-29 DIAGNOSIS — A599 Trichomoniasis, unspecified: Secondary | ICD-10-CM | POA: Insufficient documentation

## 2019-01-29 LAB — WET PREP FOR TRICH, YEAST, CLUE
Clue Cell Exam: POSITIVE — AB
Trichomonas Exam: POSITIVE — AB
Yeast Exam: NEGATIVE

## 2019-01-29 MED ORDER — METRONIDAZOLE 500 MG PO TABS: 2000.0000 mg | ORAL_TABLET | Freq: Once | ORAL | 0 refills | Status: AC

## 2019-01-29 NOTE — Addendum Note (Signed)
Addended by: Doy Mince on: 01/29/2019 04:47 PM   Modules accepted: Orders

## 2019-01-29 NOTE — Progress Notes (Signed)
Wet mount reviewed, pt treated for Trich only per standing order, provider notified. Provider orders completed.

## 2019-01-29 NOTE — Progress Notes (Signed)
    STI clinic/screening visit  Subjective:  Veronica Walters is a 28 y.o. BF G78P3 exsmoker female being seen today for an STI screening visit. The patient reports they do not have symptoms.  Patient has the following medical conditions:   Patient Active Problem List   Diagnosis Date Noted  . Placenta abruption, delivered, current hospitalization 03/16/2015     Chief Complaint  Patient presents with  . SEXUALLY TRANSMITTED DISEASE    HPI  Patient reports no symptoms.  LMLP 01/24/19.  Last coitus yesterday without condom.  Happy with ocp's  See flowsheet for further details and programmatic requirements.    The following portions of the patient's history were reviewed and updated as appropriate: allergies, current medications, past medical history, past social history, past surgical history and problem list.  Objective:  There were no vitals filed for this visit.  Physical Exam Constitutional:      Appearance: Normal appearance.  HENT:     Head: Normocephalic and atraumatic.     Mouth/Throat:     Mouth: Mucous membranes are moist.  Eyes:     Conjunctiva/sclera: Conjunctivae normal.  Neck:     Musculoskeletal: Neck supple.  Pulmonary:     Effort: Pulmonary effort is normal.     Breath sounds: Normal breath sounds.  Abdominal:     Palpations: Abdomen is soft.     Tenderness: There is no abdominal tenderness. There is no guarding.     Hernia: No hernia is present.     Comments: Poor tone, soft without tenderness  Genitourinary:    General: Normal vulva.     Exam position: Lithotomy position.     Labia:        Right: No lesion.        Left: No lesion.      Vagina: Vaginal discharge (milky pink malodorous, ph>4.5) present. No erythema.     Cervix: No cervical motion tenderness.     Uterus: Normal.      Adnexa: Right adnexa normal and left adnexa normal.     Rectum: Normal.  Lymphadenopathy:     Lower Body: No right inguinal adenopathy. No left inguinal adenopathy.   Skin:    General: Skin is dry.  Neurological:     Mental Status: She is alert.  Psychiatric:        Mood and Affect: Mood normal.       Assessment and Plan:  Veronica Walters is a 28 y.o. female presenting to the [facility] Department for STI screening  1. Screening examination for venereal disease Treat wet mount per standing orders Immunization nurse consult - WET PREP FOR [place], YEAST, CLUE - Chlamydia/Gonorrhea [place] [facility] - Syphilis Serology, [place] [facility]     Return if symptoms worsen or fail to improve.  No future appointments.  Herbie Saxon, CNM

## 2019-02-04 ENCOUNTER — Telehealth: Payer: Self-pay | Admitting: Family Medicine

## 2019-02-04 NOTE — Telephone Encounter (Signed)
PATIENT SEEN ON HER MYCHART THAT SOMETHING CAME BACK POSITIVE  AND SHE WANTS TO KNOW WHAT IT IS AND WHY HASN'T SHE BEEN CONTACTED ABOUT IT.

## 2019-02-05 NOTE — Telephone Encounter (Signed)
TC with patient. Verified ID via password. Patient didn't understand why wet mount had positive results.  Explained to patient that wet mount is positive because she had Trich; given MTZ for this infection when she was at her visit. Patient verbalized understanding Aileen Fass, RN

## 2020-01-24 ENCOUNTER — Emergency Department
Admission: EM | Admit: 2020-01-24 | Discharge: 2020-01-24 | Discharge status: Against Medical Advice | Payer: BLUE CROSS/BLUE SHIELD

## 2020-01-24 NOTE — ED Notes (Signed)
Pt called in the WR with no response 

## 2020-10-20 ENCOUNTER — Other Ambulatory Visit: Payer: Self-pay

## 2020-10-20 ENCOUNTER — Ambulatory Visit (LOCAL_COMMUNITY_HEALTH_CENTER): Payer: Self-pay

## 2020-10-20 DIAGNOSIS — Z111 Encounter for screening for respiratory tuberculosis: Secondary | ICD-10-CM

## 2020-10-23 ENCOUNTER — Ambulatory Visit (LOCAL_COMMUNITY_HEALTH_CENTER): Payer: Self-pay

## 2020-10-23 ENCOUNTER — Other Ambulatory Visit: Payer: Self-pay

## 2020-10-23 DIAGNOSIS — Z111 Encounter for screening for respiratory tuberculosis: Secondary | ICD-10-CM

## 2020-10-23 LAB — TB SKIN TEST
Induration: 0 mm
TB Skin Test: NEGATIVE

## 2020-10-31 ENCOUNTER — Encounter: Payer: Self-pay | Admitting: Family Medicine

## 2020-10-31 ENCOUNTER — Other Ambulatory Visit: Payer: Self-pay

## 2020-10-31 ENCOUNTER — Ambulatory Visit: Payer: Self-pay

## 2020-10-31 ENCOUNTER — Ambulatory Visit: Payer: Self-pay | Admitting: Family Medicine

## 2020-10-31 DIAGNOSIS — Z113 Encounter for screening for infections with a predominantly sexual mode of transmission: Secondary | ICD-10-CM

## 2020-10-31 LAB — WET PREP FOR TRICH, YEAST, CLUE
Trichomonas Exam: NEGATIVE
Yeast Exam: NEGATIVE

## 2020-10-31 MED ORDER — METRONIDAZOLE 500 MG PO TABS: 500.0000 mg | ORAL_TABLET | Freq: Two times a day (BID) | ORAL | 0 refills | Status: AC

## 2020-10-31 NOTE — Progress Notes (Signed)
[facility] Department STI clinic/screening visit  Subjective:  Veronica Walters is a 30 y.o. female being seen today for an STI screening visit. The patient reports they do have symptoms.  Patient reports that they do not desire a pregnancy in the next year.   They reported they are interested in discussing contraception today.  Patient's last menstrual period was 10/18/2020 (exact date).   Patient has the following medical conditions:   Patient Active Problem List   Diagnosis Date Noted   Trich 01/29/19 01/29/2019   Placenta abruption, delivered, current hospitalization 03/16/2015    Chief Complaint  Patient presents with   SEXUALLY TRANSMITTED DISEASE    Screening     HPI  Patient reports here for screening, has symptoms  Last HIV test per patient/review of record was 01/29/2019 Patient reports last pap was   See flowsheet for further details and programmatic requirements.    The following portions of the patient's history were reviewed and updated as appropriate: allergies, current medications, past medical history, past social history, past surgical history and problem list.  Objective:  There were no vitals filed for this visit.  Physical Exam Vitals and nursing note reviewed.  Constitutional:      Appearance: Normal appearance.  HENT:     Head: Normocephalic and atraumatic.     Mouth/Throat:     Mouth: Mucous membranes are moist.     Pharynx: Oropharynx is clear. No oropharyngeal exudate or posterior oropharyngeal erythema.  Pulmonary:     Effort: Pulmonary effort is normal.  Chest:  Breasts:    Right: No axillary adenopathy or supraclavicular adenopathy.     Left: No axillary adenopathy or supraclavicular adenopathy.  Abdominal:     General: Abdomen is flat.     Palpations: There is no mass.     Tenderness: There is no abdominal tenderness. There is no rebound.  Genitourinary:    General: Normal vulva.     Exam position: Lithotomy position.      Pubic Area: No rash or pubic lice.      Labia:        Right: No rash or lesion.        Left: No rash or lesion.      Vagina: Normal. No vaginal discharge, erythema, bleeding or lesions.     Cervix: No cervical motion tenderness, discharge, friability, lesion or erythema.     Uterus: Normal.      Adnexa: Right adnexa normal and left adnexa normal.     Rectum: Normal.     Comments: External genitalia without, lice, nits, erythema, edema , lesions or inguinal adenopathy. Vagina with normal mucosa and bloody discharge and pH > 4.  Cervix without visual lesions, uterus firm, mobile, non-tender, no masses, CMT adnexal fullness or tenderness.   Lymphadenopathy:     Head:     Right side of head: No preauricular or posterior auricular adenopathy.     Left side of head: No preauricular or posterior auricular adenopathy.     Cervical: No cervical adenopathy.     Upper Body:     Right upper body: No supraclavicular or axillary adenopathy.     Left upper body: No supraclavicular or axillary adenopathy.     Lower Body: No right inguinal adenopathy. No left inguinal adenopathy.  Skin:    General: Skin is warm and dry.     Findings: No rash.  Neurological:     Mental Status: She is alert and oriented to person, place,  and time.     Assessment and Plan:  Veronica Walters is a 30 y.o. female presenting to the [facility] Department for STI screening  1. Screening examination for venereal disease  - Chlamydia/Gonorrhea [place] [facility] - HIV [place] [facility] - Syphilis Serology, [facility] - WET PREP FOR TRICH, YEAST, CLUE - Chlamydia/Gonorrhea [facility]   Patient accepted all screenings including wet prep, oral, vaginal CT/GC and bloodwork for HIV/RPR.  Patient meets criteria for HepB screening? No. Ordered? No - does not meet criteria  Patient meets criteria for HepC screening? No. Ordered? No - does not meet criteria   Wet prep results + amine   Treatment needed  for BV with  Metronidazole 500 mg PO BID x 7 days.  Discussed time line for [facility] results and that patient will be called with positive results and encouraged patient to call if she had not heard in 2 weeks.  Counseled to return or seek care for continued or worsening symptoms Recommended condom use with all sex  Patient is currently using  No BCM   to prevent pregnancy.    No follow-ups on file.  No future appointments.  Wendi Snipes, FNP

## 2020-10-31 NOTE — Progress Notes (Signed)
Wet prep reviewed with provider, Veronica Rising, FNP during clinic visit.   Treated for BV per standing order. Instructions given to take Metronidazole 500mg  BID for 7 days and no alcohol during treatment and 48 hours after. BV pamphlet given to patient.   All questions answered.   , RN

## 2021-07-15 ENCOUNTER — Emergency Department
Admission: EM | Admit: 2021-07-15 | Discharge: 2021-07-15 | Disposition: A | Discharge status: Home / Self Care | Payer: No Typology Code available for payment source | Attending: Emergency Medicine | Admitting: Emergency Medicine

## 2021-07-15 ENCOUNTER — Other Ambulatory Visit: Payer: Self-pay

## 2021-07-15 ENCOUNTER — Emergency Department: Payer: No Typology Code available for payment source

## 2021-07-15 DIAGNOSIS — M79652 Pain in left thigh: Secondary | ICD-10-CM | POA: Diagnosis not present

## 2021-07-15 DIAGNOSIS — M79605 Pain in left leg: Secondary | ICD-10-CM | POA: Insufficient documentation

## 2021-07-15 DIAGNOSIS — M545 Low back pain, unspecified: Secondary | ICD-10-CM | POA: Insufficient documentation

## 2021-07-15 DIAGNOSIS — M791 Myalgia, unspecified site: Secondary | ICD-10-CM | POA: Diagnosis not present

## 2021-07-15 DIAGNOSIS — Y92410 Unspecified street and highway as the place of occurrence of the external cause: Secondary | ICD-10-CM | POA: Diagnosis not present

## 2021-07-15 DIAGNOSIS — M25532 Pain in left wrist: Secondary | ICD-10-CM | POA: Insufficient documentation

## 2021-07-15 DIAGNOSIS — M7918 Myalgia, other site: Secondary | ICD-10-CM

## 2021-07-15 MED ORDER — GABAPENTIN 300 MG PO CAPS: 300.0000 mg | ORAL_CAPSULE | Freq: Three times a day (TID) | ORAL | 0 refills | Status: DC

## 2021-07-15 MED ORDER — GABAPENTIN 300 MG PO CAPS
300.0000 mg | ORAL_CAPSULE | Freq: Once | ORAL | Status: AC
  Administered 2021-07-15: 300 mg via ORAL
  Filled 2021-07-15: qty 1
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
## 2021-07-15 NOTE — Discharge Instructions (Signed)
Please seek medical attention for any high fevers, chest pain, shortness of breath, change in behavior, persistent vomiting, bloody stool or any other new or concerning symptoms.  

## 2021-07-30 ENCOUNTER — Ambulatory Visit
Admission: RE | Admit: 2021-07-30 | Discharge: 2021-07-30 | Disposition: A | Discharge status: Home / Self Care | Payer: Self-pay | Source: Ambulatory Visit | Attending: Chiropractor | Admitting: Chiropractor

## 2021-07-30 ENCOUNTER — Other Ambulatory Visit: Payer: Self-pay | Admitting: Chiropractor

## 2021-07-30 ENCOUNTER — Ambulatory Visit
Admission: RE | Admit: 2021-07-30 | Discharge: 2021-07-30 | Disposition: A | Discharge status: Home / Self Care | Payer: Self-pay | Attending: Chiropractor | Admitting: Chiropractor

## 2021-07-30 DIAGNOSIS — S39012A Strain of muscle, fascia and tendon of lower back, initial encounter: Secondary | ICD-10-CM

## 2021-07-30 DIAGNOSIS — S161XXA Strain of muscle, fascia and tendon at neck level, initial encounter: Secondary | ICD-10-CM

## 2021-07-30 DIAGNOSIS — M25532 Pain in left wrist: Secondary | ICD-10-CM | POA: Insufficient documentation

## 2021-07-30 DIAGNOSIS — S233XXA Sprain of ligaments of thoracic spine, initial encounter: Secondary | ICD-10-CM

## 2021-08-20 ENCOUNTER — Other Ambulatory Visit: Payer: Self-pay | Admitting: Chiropractor

## 2021-08-20 ENCOUNTER — Ambulatory Visit
Admission: RE | Admit: 2021-08-20 | Discharge: 2021-08-20 | Disposition: A | Discharge status: Home / Self Care | Payer: Medicaid Other | Attending: Chiropractor | Admitting: Chiropractor

## 2021-08-20 ENCOUNTER — Ambulatory Visit
Admission: RE | Admit: 2021-08-20 | Discharge: 2021-08-20 | Disposition: A | Discharge status: Home / Self Care | Payer: Medicaid Other | Source: Ambulatory Visit | Attending: Chiropractor | Admitting: Chiropractor

## 2021-08-20 DIAGNOSIS — M25522 Pain in left elbow: Secondary | ICD-10-CM | POA: Diagnosis present

## 2021-08-20 DIAGNOSIS — S62102A Fracture of unspecified carpal bone, left wrist, initial encounter for closed fracture: Secondary | ICD-10-CM | POA: Insufficient documentation

## 2021-08-20 DIAGNOSIS — G8929 Other chronic pain: Secondary | ICD-10-CM | POA: Insufficient documentation

## 2021-10-05 ENCOUNTER — Ambulatory Visit (LOCAL_COMMUNITY_HEALTH_CENTER): Payer: Self-pay

## 2021-10-05 VITALS — BP 127/88 | Ht 63.0 in | Wt 172.5 lb

## 2021-10-05 DIAGNOSIS — Z3201 Encounter for pregnancy test, result positive: Secondary | ICD-10-CM

## 2021-10-05 LAB — PREGNANCY, URINE: Preg Test, Ur: POSITIVE — AB

## 2021-10-05 MED ORDER — PRENATAL 27-0.8 MG PO TABS: 1.0000 | ORAL_TABLET | Freq: Every day | ORAL | 0 refills | Status: AC

## 2021-10-05 NOTE — Progress Notes (Signed)
UPT positive. Unsure of prenatal plans. Local provider resource list given and reviewed need to apply for medicaid/preg women. Jerel Shepherd, RN

## 2021-11-05 ENCOUNTER — Emergency Department: Payer: Medicaid Other

## 2021-11-05 ENCOUNTER — Emergency Department
Admission: EM | Admit: 2021-11-05 | Discharge: 2021-11-06 | Disposition: A | Discharge status: Home / Self Care | Payer: Medicaid Other | Attending: Emergency Medicine | Admitting: Emergency Medicine

## 2021-11-05 ENCOUNTER — Encounter: Payer: Self-pay | Admitting: Emergency Medicine

## 2021-11-05 DIAGNOSIS — O4690 Antepartum hemorrhage, unspecified, unspecified trimester: Secondary | ICD-10-CM

## 2021-11-05 DIAGNOSIS — Z3A12 12 weeks gestation of pregnancy: Secondary | ICD-10-CM | POA: Insufficient documentation

## 2021-11-05 DIAGNOSIS — D72829 Elevated white blood cell count, unspecified: Secondary | ICD-10-CM | POA: Diagnosis not present

## 2021-11-05 DIAGNOSIS — N83202 Unspecified ovarian cyst, left side: Secondary | ICD-10-CM | POA: Diagnosis not present

## 2021-11-05 DIAGNOSIS — O209 Hemorrhage in early pregnancy, unspecified: Secondary | ICD-10-CM | POA: Insufficient documentation

## 2021-11-05 LAB — CBC WITH DIFFERENTIAL/PLATELET
Abs Immature Granulocytes: 0.07 10*3/uL (ref 0.00–0.07)
Basophils Absolute: 0.1 10*3/uL (ref 0.0–0.1)
Basophils Relative: 0 %
Eosinophils Absolute: 0.2 10*3/uL (ref 0.0–0.5)
Eosinophils Relative: 2 %
HCT: 31.1 % — ABNORMAL LOW (ref 36.0–46.0)
Hemoglobin: 10 g/dL — ABNORMAL LOW (ref 12.0–15.0)
Immature Granulocytes: 1 %
Lymphocytes Relative: 31 %
Lymphs Abs: 4.1 10*3/uL — ABNORMAL HIGH (ref 0.7–4.0)
MCH: 26.9 pg (ref 26.0–34.0)
MCHC: 32.2 g/dL (ref 30.0–36.0)
MCV: 83.6 fL (ref 80.0–100.0)
Monocytes Absolute: 1.1 10*3/uL — ABNORMAL HIGH (ref 0.1–1.0)
Monocytes Relative: 8 %
Neutro Abs: 7.8 10*3/uL — ABNORMAL HIGH (ref 1.7–7.7)
Neutrophils Relative %: 58 %
Platelets: 369 10*3/uL (ref 150–400)
RBC: 3.72 MIL/uL — ABNORMAL LOW (ref 3.87–5.11)
RDW: 14.6 % (ref 11.5–15.5)
WBC: 13.3 10*3/uL — ABNORMAL HIGH (ref 4.0–10.5)
nRBC: 0 % (ref 0.0–0.2)

## 2021-11-05 LAB — COMPREHENSIVE METABOLIC PANEL
ALT: 10 U/L (ref 0–44)
AST: 13 U/L — ABNORMAL LOW (ref 15–41)
Albumin: 4 g/dL (ref 3.5–5.0)
Alkaline Phosphatase: 69 U/L (ref 38–126)
Anion gap: 8 (ref 5–15)
BUN: 9 mg/dL (ref 6–20)
CO2: 22 mmol/L (ref 22–32)
Calcium: 9.4 mg/dL (ref 8.9–10.3)
Chloride: 104 mmol/L (ref 98–111)
Creatinine, Ser: 0.51 mg/dL (ref 0.44–1.00)
GFR, Estimated: >60 mL/min (ref >60)
Glucose, Bld: 94 mg/dL (ref 70–99)
Potassium: 3.9 mmol/L (ref 3.5–5.1)
Sodium: 134 mmol/L — ABNORMAL LOW (ref 135–145)
Total Bilirubin: 0.5 mg/dL (ref 0.3–1.2)
Total Protein: 7.8 g/dL (ref 6.5–8.1)

## 2021-11-05 LAB — URINALYSIS, ROUTINE W REFLEX MICROSCOPIC
Bilirubin Urine: NEGATIVE
Glucose, UA: NEGATIVE mg/dL
Hgb urine dipstick: NEGATIVE
Ketones, ur: NEGATIVE mg/dL
Leukocytes,Ua: NEGATIVE
Nitrite: NEGATIVE
Protein, ur: 30 mg/dL — AB
Specific Gravity, Urine: 1.03 (ref 1.005–1.030)
pH: 5 (ref 5.0–8.0)

## 2021-11-05 LAB — POC URINE PREG, ED: Preg Test, Ur: POSITIVE — AB

## 2021-11-05 LAB — HCG, QUANTITATIVE, PREGNANCY: hCG, Beta Chain, Quant, S: 158023 m[IU]/mL — ABNORMAL HIGH (ref <5)

## 2021-11-05 NOTE — ED Notes (Signed)
Pt has blood type A- per chart results from 2018; spoke with blood bank who st to order type and screen in prep for possible rhogam admin

## 2021-11-06 LAB — WET PREP, GENITAL
Clue Cells Wet Prep HPF POC: NONE SEEN
Sperm: NONE SEEN
Trich, Wet Prep: NONE SEEN
WBC, Wet Prep HPF POC: <10 (ref <10)
Yeast Wet Prep HPF POC: NONE SEEN

## 2021-11-06 LAB — TYPE AND SCREEN
ABO/RH(D): A NEG
Antibody Screen: POSITIVE

## 2021-11-06 LAB — CHLAMYDIA/NGC RT PCR (ARMC ONLY)
Chlamydia Tr: NOT DETECTED
N gonorrhoeae: NOT DETECTED

## 2021-11-06 MED ORDER — ACETAMINOPHEN 500 MG PO TABS
1000.0000 mg | ORAL_TABLET | Freq: Once | ORAL | Status: AC
  Administered 2021-11-06: 1000 mg via ORAL
  Filled 2021-11-06: qty 2

## 2021-11-06 MED ORDER — RHO D IMMUNE GLOBULIN 1500 UNIT/2ML IJ SOSY
300.0000 ug | PREFILLED_SYRINGE | Freq: Once | INTRAMUSCULAR | Status: AC
Start: 2021-11-06 — End: 2021-11-06
  Administered 2021-11-06: 300 ug via INTRAMUSCULAR
  Filled 2021-11-06: qty 2

## 2021-11-07 LAB — RHOGAM INJECTION: Unit division: 0

## 2021-11-28 ENCOUNTER — Other Ambulatory Visit: Payer: Self-pay | Admitting: Family Medicine

## 2021-11-28 DIAGNOSIS — Z3689 Encounter for other specified antenatal screening: Secondary | ICD-10-CM

## 2021-11-28 LAB — OB RESULTS CONSOLE HEPATITIS B SURFACE ANTIGEN: Hepatitis B Surface Ag: NEGATIVE

## 2021-11-28 LAB — OB RESULTS CONSOLE RPR: RPR: NONREACTIVE

## 2021-11-28 LAB — OB RESULTS CONSOLE RUBELLA ANTIBODY, IGM: Rubella: IMMUNE

## 2021-11-28 LAB — OB RESULTS CONSOLE VARICELLA ZOSTER ANTIBODY, IGG: Varicella: IMMUNE

## 2021-11-28 LAB — OB RESULTS CONSOLE HIV ANTIBODY (ROUTINE TESTING): HIV: NONREACTIVE

## 2021-11-29 LAB — OB RESULTS CONSOLE GC/CHLAMYDIA
Chlamydia: NEGATIVE
Neisseria Gonorrhea: NEGATIVE

## 2021-12-07 ENCOUNTER — Encounter: Payer: Self-pay | Admitting: Emergency Medicine

## 2021-12-07 ENCOUNTER — Emergency Department: Payer: Medicaid Other

## 2021-12-07 ENCOUNTER — Other Ambulatory Visit: Payer: Self-pay

## 2021-12-07 ENCOUNTER — Emergency Department
Admission: EM | Admit: 2021-12-07 | Discharge: 2021-12-08 | Disposition: A | Discharge status: Home / Self Care | Payer: Medicaid Other | Attending: Emergency Medicine | Admitting: Emergency Medicine

## 2021-12-07 DIAGNOSIS — Z3A17 17 weeks gestation of pregnancy: Secondary | ICD-10-CM | POA: Diagnosis not present

## 2021-12-07 DIAGNOSIS — O26899 Other specified pregnancy related conditions, unspecified trimester: Secondary | ICD-10-CM

## 2021-12-07 DIAGNOSIS — R102 Pelvic and perineal pain: Secondary | ICD-10-CM

## 2021-12-07 DIAGNOSIS — O26892 Other specified pregnancy related conditions, second trimester: Secondary | ICD-10-CM | POA: Insufficient documentation

## 2021-12-07 LAB — COMPREHENSIVE METABOLIC PANEL
ALT: 10 U/L (ref 0–44)
AST: 15 U/L (ref 15–41)
Albumin: 3.4 g/dL — ABNORMAL LOW (ref 3.5–5.0)
Alkaline Phosphatase: 71 U/L (ref 38–126)
Anion gap: 7 (ref 5–15)
BUN: 7 mg/dL (ref 6–20)
CO2: 22 mmol/L (ref 22–32)
Calcium: 8.8 mg/dL — ABNORMAL LOW (ref 8.9–10.3)
Chloride: 106 mmol/L (ref 98–111)
Creatinine, Ser: 0.49 mg/dL (ref 0.44–1.00)
GFR, Estimated: >60 mL/min (ref >60)
Glucose, Bld: 91 mg/dL (ref 70–99)
Potassium: 3.5 mmol/L (ref 3.5–5.1)
Sodium: 135 mmol/L (ref 135–145)
Total Bilirubin: 0.5 mg/dL (ref 0.3–1.2)
Total Protein: 7 g/dL (ref 6.5–8.1)

## 2021-12-07 LAB — URINALYSIS, ROUTINE W REFLEX MICROSCOPIC
Bilirubin Urine: NEGATIVE
Glucose, UA: NEGATIVE mg/dL
Hgb urine dipstick: NEGATIVE
Ketones, ur: NEGATIVE mg/dL
Leukocytes,Ua: NEGATIVE
Nitrite: NEGATIVE
Protein, ur: NEGATIVE mg/dL
Specific Gravity, Urine: 1.009 (ref 1.005–1.030)
pH: 7 (ref 5.0–8.0)

## 2021-12-07 LAB — CBC
HCT: 30.5 % — ABNORMAL LOW (ref 36.0–46.0)
Hemoglobin: 10 g/dL — ABNORMAL LOW (ref 12.0–15.0)
MCH: 27.5 pg (ref 26.0–34.0)
MCHC: 32.8 g/dL (ref 30.0–36.0)
MCV: 83.8 fL (ref 80.0–100.0)
Platelets: 335 10*3/uL (ref 150–400)
RBC: 3.64 MIL/uL — ABNORMAL LOW (ref 3.87–5.11)
RDW: 13.7 % (ref 11.5–15.5)
WBC: 11.2 10*3/uL — ABNORMAL HIGH (ref 4.0–10.5)
nRBC: 0 % (ref 0.0–0.2)

## 2021-12-07 LAB — HCG, QUANTITATIVE, PREGNANCY: hCG, Beta Chain, Quant, S: 51671 m[IU]/mL — ABNORMAL HIGH (ref <5)

## 2021-12-07 NOTE — ED Triage Notes (Signed)
Pt reports she is [redacted] weeks pregnant with LLQ pain. Pt reports she has a history of ovarian cyst and was informed to come to ER if she experienced the pain again. Pt talks in complete sentences no distress noted

## 2021-12-08 MED ORDER — SODIUM CHLORIDE 0.9 % IV BOLUS (SEPSIS)
1000.0000 mL | Freq: Once | INTRAVENOUS | Status: AC
  Administered 2021-12-08: 1000 mL via INTRAVENOUS

## 2021-12-08 MED ORDER — ACETAMINOPHEN 500 MG PO TABS
1000.0000 mg | ORAL_TABLET | Freq: Once | ORAL | Status: AC
  Administered 2021-12-08: 1000 mg via ORAL
  Filled 2021-12-08: qty 2

## 2021-12-08 NOTE — Discharge Instructions (Signed)
You may take Tylenol 1000 mg every 6 hours as needed for pain.  Please increase your fluid intake over the next several days.  Please follow-up with your OB/GYN if you continue to have discomfort.

## 2021-12-08 NOTE — ED Provider Notes (Signed)
[facility] Provider Note    Event Date/Time   First MD Initiated Contact with Patient 12/07/21 2357     (approximate)   History   Abdominal Pain   HPI  Veronica Walters is a 31 y.o. female G8P3 who presents to the emergency department with complaints of left lower quadrant abdominal pain.  She is approximately [redacted] weeks pregnant now.  States she was recently diagnosed with a left ovarian cyst and was told to come back if pain worsened.  She was seen in the ED on May 26 and CT scan showed a 7.2 cm simple left ovarian cyst.  No bleeding, discharge, nausea, vomiting, diarrhea, bloody stools or melena, fever.  Pain is worse with movement.   History provided by patient and significant other.    Past Medical History:  Diagnosis Date   Anemia    Herpes labialis     Past Surgical History:  Procedure Laterality Date   CESAREAN SECTION N/A 03/16/2015   Procedure: CESAREAN SECTION;  Surgeon: Conard Novak, MD;  Location: [facility] ORS;  Service: Obstetrics;  Laterality: N/A;   THERAPEUTIC ABORTION  2006, 2010, 2013    MEDICATIONS:  Prior to Admission medications   Medication Sig Start Date End Date Taking? Authorizing Provider  gabapentin (NEURONTIN) 300 MG capsule Take 1 capsule (300 mg total) by mouth 3 (three) times daily. Patient not taking: Reported on 10/05/2021 07/15/21 07/15/22  Phineas Semen, MD  Prenatal Vit-Fe Fumarate-FA (MULTIVITAMIN-PRENATAL) 27-0.8 MG TABS tablet Take 1 tablet by mouth daily at 12 noon. 10/05/21 01/13/22  Federico Flake, MD    Physical Exam   Triage Vital Signs: ED Triage Vitals  Enc Vitals Group     BP 12/07/21 2214 110/73     Pulse Rate 12/07/21 2214 87     Resp 12/07/21 2214 16     Temp 12/07/21 2214 98.6 F (37 C)     Temp Source 12/07/21 2214 Oral     SpO2 12/07/21 2214 100 %     Weight 12/07/21 2215 172 lb (78 kg)     Height 12/07/21 2215 5\' 3"  (1.6 m)     Head Circumference --      Peak Flow  --      Pain Score 12/07/21 2215 7     Pain Loc --      Pain Edu? --      Excl. in GC? --     Most recent vital signs: Vitals:   12/08/21 0010 12/08/21 0115  BP:  (!) 97/55  Pulse:  72  Resp:  16  Temp:    SpO2: 99% 100%    CONSTITUTIONAL: Alert and oriented and responds appropriately to questions. Well-appearing; well-nourished HEAD: Normocephalic, atraumatic EYES: Conjunctivae clear, pupils appear equal, sclera nonicteric ENT: normal nose; moist mucous membranes NECK: Supple, normal ROM CARD: RRR; S1 and S2 appreciated; no murmurs, no clicks, no rubs, no gallops RESP: Normal chest excursion without splinting or tachypnea; breath sounds clear and equal bilaterally; no wheezes, no rhonchi, no rales, no hypoxia or respiratory distress, speaking full sentences ABD/GI: Normal bowel sounds; gravid uterus.  Soft and nontender.  No peritoneal signs. BACK: The back appears normal EXT: Normal ROM in all joints; no deformity noted, no edema; no cyanosis SKIN: Normal color for age and race; warm; no rash on exposed skin NEURO: Moves all extremities equally, normal speech PSYCH: The patient's mood and manner are appropriate.   ED Results / Procedures / Treatments  LABS: (all labs ordered are listed, but only abnormal results are displayed) Labs Reviewed  CBC - Abnormal; Notable for the following components:      Result Value   WBC 11.2 (*)    RBC 3.64 (*)    Hemoglobin 10.0 (*)    HCT 30.5 (*)    All other components within normal limits  COMPREHENSIVE METABOLIC PANEL - Abnormal; Notable for the following components:   Calcium 8.8 (*)    Albumin 3.4 (*)    All other components within normal limits  URINALYSIS, ROUTINE W REFLEX MICROSCOPIC - Abnormal; Notable for the following components:   Color, Urine YELLOW (*)    APPearance CLEAR (*)    All other components within normal limits  HCG, QUANTITATIVE, PREGNANCY - Abnormal; Notable for the following components:   hCG, Beta  Chain, Quant, S 51,671 (*)    All other components within normal limits     EKG:   RADIOLOGY: My personal review and interpretation of imaging: Ultrasound shows normal adnexa and cyst is no longer present.  Normal blood flow to the left ovary.  I have personally reviewed all radiology reports.   US OB Limited  Result Date: 12/08/2021 CLINICAL DATA:  Pelvic pain in pregnancy. Gestational age based on LMP: 35 weeks, 1 day. EXAM: LIMITED OBSTETRIC ULTRASOUND AND DOPPLER US OF OVARIES FINDINGS: Number of Fetuses: 1 Heart Rate:  144 bpm Movement: Detected Presentation: Cephalic Placental Location: Anterior Previa: No previa. The pelvis into appears marginal. The edge of the placenta is approximately 4 mm to the level of the internal os. Amniotic Fluid (Subjective):  Within normal limits. BPD:  3.5cm 16w 5d MATERNAL FINDINGS: Cervix: Appears closed. The cervix measures approximately 3.8 cm in length. Uterus/Adnexae: No abnormality visualized. Pulsed Doppler evaluation of the left ovary demonstrates normal low-resistance arterial and venous waveforms. Other findings No free fluid. IMPRESSION: 1. Single live intrauterine pregnancy with an estimated gestational age of [redacted] weeks, 5 days. 2. Marginal placenta. 3. Doppler detected arterial and venous flow to the left ovary. This exam is performed on an emergent basis and does not comprehensively evaluate fetal size, dating, or anatomy; follow-up complete OB US should be considered if further fetal assessment is warranted. Electronically Signed   By: Anner Crete M.D.   On: 12/08/2021 00:16   US PELVIC DOPPLER (TORSION R/O OR MASS ARTERIAL FLOW)  Result Date: 12/08/2021 CLINICAL DATA:  Pelvic pain in pregnancy. Gestational age based on LMP: 28 weeks, 1 day. EXAM: LIMITED OBSTETRIC ULTRASOUND AND DOPPLER US OF OVARIES FINDINGS: Number of Fetuses: 1 Heart Rate:  144 bpm Movement: Detected Presentation: Cephalic Placental Location: Anterior Previa: No previa.  The pelvis into appears marginal. The edge of the placenta is approximately 4 mm to the level of the internal os. Amniotic Fluid (Subjective):  Within normal limits. BPD:  3.5cm 16w 5d MATERNAL FINDINGS: Cervix: Appears closed. The cervix measures approximately 3.8 cm in length. Uterus/Adnexae: No abnormality visualized. Pulsed Doppler evaluation of the left ovary demonstrates normal low-resistance arterial and venous waveforms. Other findings No free fluid. IMPRESSION: 1. Single live intrauterine pregnancy with an estimated gestational age of [redacted] weeks, 5 days. 2. Marginal placenta. 3. Doppler detected arterial and venous flow to the left ovary. This exam is performed on an emergent basis and does not comprehensively evaluate fetal size, dating, or anatomy; follow-up complete OB US should be considered if further fetal assessment is warranted. Electronically Signed   By: Anner Crete M.D.   On: 12/08/2021  00:16     PROCEDURES:  Critical Care performed: No      Procedures    IMPRESSION / MDM / ASSESSMENT AND PLAN / ED COURSE  I reviewed the triage vital signs and the nursing notes.    Patient here with left-sided pelvic pain with recent diagnosis of a large simple left ovarian cyst.     DIFFERENTIAL DIAGNOSIS (includes but not limited to):   Ovarian cyst, torsion, doubt TOA or PID given she is [redacted] weeks pregnant.  UTI also on the differential.  Low suspicion for diverticulitis, colitis, bowel obstruction.  Doubt appendicitis.  Could also be round ligament pain.   Patient's presentation is most consistent with acute presentation with potential threat to life or bodily function.   PLAN: We will obtain CBC, BMP, urinalysis, hCG, pelvic ultrasound with Doppler of the left ovary.  Will give Tylenol and IV fluids for symptom relief.   MEDICATIONS GIVEN IN ED: Medications  acetaminophen (TYLENOL) tablet 1,000 mg (1,000 mg Oral Given 12/08/21 0015)  sodium chloride 0.9 % bolus 1,000 mL  (0 mLs Intravenous Stopped 12/08/21 0115)     ED COURSE: Patient's labs show slight leukocytosis which has improved since June 2023 and likely due to pregnancy.  Normal electrolytes, renal function, LFTs.  hCG is 51,000.  Urine does not show any sign of infection, ketones or protein.  OB ultrasound reviewed and interpreted by myself and the radiologist and shows a single live intrauterine pregnancy with normal cardiac activity and movement.  She has good blood flow to the left ovary and adnexa appear normal without any cysts.  She reports she is feeling much better after IV fluids and Tylenol.  Her abdominal exam is benign.  She is tolerating p.o.  I feel she is safe for discharge home and follow-up with her OB/GYN as needed.  Discussed increase fluid intake and using Tylenol at home for symptom management.   At this time, I do not feel there is any life-threatening condition present. I reviewed all nursing notes, vitals, pertinent previous records.  All lab and urine results, EKGs, imaging ordered have been independently reviewed and interpreted by myself.  I reviewed all available radiology reports from any imaging ordered this visit.  Based on my assessment, I feel the patient is safe to be discharged home without further emergent workup and can continue workup as an outpatient as needed. Discussed all findings, treatment plan as well as usual and customary return precautions.  They verbalize understanding and are comfortable with this plan.  Outpatient follow-up has been provided as needed.  All questions have been answered.    CONSULTS: No emergent OB/GYN consult needed at this time.  No cyst or torsion seen on ultrasound and pain is improved.   OUTSIDE RECORDS REVIEWED: Reviewed patient's last note with Dr. Chalmers Guest on 01/24/2020.       FINAL CLINICAL IMPRESSION(S) / ED DIAGNOSES   Final diagnoses:  Pelvic pain in pregnancy     Rx / DC Orders   ED Discharge Orders     None         Note:  This document was prepared using Dragon voice recognition software and may include unintentional dictation errors.   Damia Bobrowski, Layla Maw, DO 12/08/21 [phone]

## 2021-12-21 ENCOUNTER — Telehealth: Payer: Self-pay

## 2021-12-21 NOTE — Telephone Encounter (Signed)
Return call by patient today.  She reports going to [facility] for [facility].  Hart Carwin, RN

## 2021-12-21 NOTE — Telephone Encounter (Signed)
Telephone call to patient regarding her 10-05-21 + PT results.  Wanting to inquire about her prenatal plans.  Hart Carwin, RN

## 2021-12-25 ENCOUNTER — Other Ambulatory Visit: Payer: Self-pay

## 2021-12-27 ENCOUNTER — Other Ambulatory Visit: Payer: Self-pay

## 2021-12-27 ENCOUNTER — Ambulatory Visit (HOSPITAL_BASED_OUTPATIENT_CLINIC_OR_DEPARTMENT_OTHER): Payer: Medicaid Other | Admitting: Maternal & Fetal Medicine

## 2021-12-27 ENCOUNTER — Ambulatory Visit: Payer: Medicaid Other | Attending: Maternal & Fetal Medicine

## 2021-12-27 DIAGNOSIS — Z363 Encounter for antenatal screening for malformations: Secondary | ICD-10-CM | POA: Diagnosis present

## 2021-12-27 DIAGNOSIS — Z3689 Encounter for other specified antenatal screening: Secondary | ICD-10-CM

## 2021-12-27 DIAGNOSIS — O35BXX0 Maternal care for other (suspected) fetal abnormality and damage, fetal cardiac anomalies, not applicable or unspecified: Secondary | ICD-10-CM

## 2021-12-27 DIAGNOSIS — O360120 Maternal care for anti-D [Rh] antibodies, second trimester, not applicable or unspecified: Secondary | ICD-10-CM | POA: Diagnosis not present

## 2021-12-27 DIAGNOSIS — O34219 Maternal care for unspecified type scar from previous cesarean delivery: Secondary | ICD-10-CM | POA: Insufficient documentation

## 2021-12-27 DIAGNOSIS — Z3A20 20 weeks gestation of pregnancy: Secondary | ICD-10-CM

## 2021-12-27 DIAGNOSIS — O26892 Other specified pregnancy related conditions, second trimester: Secondary | ICD-10-CM | POA: Diagnosis not present

## 2021-12-27 DIAGNOSIS — Z6791 Unspecified blood type, Rh negative: Secondary | ICD-10-CM | POA: Insufficient documentation

## 2021-12-27 DIAGNOSIS — O09292 Supervision of pregnancy with other poor reproductive or obstetric history, second trimester: Secondary | ICD-10-CM | POA: Insufficient documentation

## 2021-12-27 DIAGNOSIS — Q210 Ventricular septal defect: Secondary | ICD-10-CM

## 2021-12-27 NOTE — Progress Notes (Addendum)
MFM Brief Note -ADDENDUM CORRECTED EDD.  Veronica Walters is a 31 yo G8 P3 who is here at 55 w 0d with an EDD of 05/16/22. She is seen today at the request of Karie Fetch, MD for a detailed ultrasound.  She is doing well without complaints. She reports have cell free DNA drawn but pending.  Her pregnancy is complicated by prior cesarean delivery, history of PPH, placental abruption and NRFHT.  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 The most common forms of VSD (muscular and membranous) are known to close spontaneously, either prenatally or postnatally. The finding of even the smallest muscular VSD appears to increase the risk for additional cardiac abnormalities. Small or moderate defects may close prenatally or within a few years after delivery. Large muscular defects manifest in infancy with tachypnea and failure to thrive and may require surgical closure during the first year of life. . The long-term prognosis is usually excellent unless delayed surgery has allowed the development of pulmonary vascular disease.  We discussed the option of diagnostic amniotiocentesis including the risk and benefits in comparison the cell free DNA. At this time Veronica Walters and her signifcant other declined additional testing, however, the opted to have a pediactric echocardiogram performed and await NIPS screening.  All questions answered.  I spent 30 minutes with > 50% in face to face consulation.  Novella Olive, MD

## 2022-01-22 ENCOUNTER — Other Ambulatory Visit: Payer: Self-pay

## 2022-01-22 DIAGNOSIS — Z98891 History of uterine scar from previous surgery: Secondary | ICD-10-CM

## 2022-01-22 DIAGNOSIS — O4590 Premature separation of placenta, unspecified, unspecified trimester: Secondary | ICD-10-CM

## 2022-01-22 DIAGNOSIS — Q210 Ventricular septal defect: Secondary | ICD-10-CM

## 2022-01-24 ENCOUNTER — Other Ambulatory Visit: Payer: Self-pay

## 2022-01-24 ENCOUNTER — Ambulatory Visit: Payer: Medicaid Other | Attending: Obstetrics

## 2022-01-24 DIAGNOSIS — O4590 Premature separation of placenta, unspecified, unspecified trimester: Secondary | ICD-10-CM

## 2022-01-24 DIAGNOSIS — O09292 Supervision of pregnancy with other poor reproductive or obstetric history, second trimester: Secondary | ICD-10-CM | POA: Diagnosis not present

## 2022-01-24 DIAGNOSIS — Z362 Encounter for other antenatal screening follow-up: Secondary | ICD-10-CM | POA: Diagnosis not present

## 2022-01-24 DIAGNOSIS — O34219 Maternal care for unspecified type scar from previous cesarean delivery: Secondary | ICD-10-CM | POA: Insufficient documentation

## 2022-01-24 DIAGNOSIS — Z98891 History of uterine scar from previous surgery: Secondary | ICD-10-CM

## 2022-01-24 DIAGNOSIS — O360120 Maternal care for anti-D [Rh] antibodies, second trimester, not applicable or unspecified: Secondary | ICD-10-CM

## 2022-01-24 DIAGNOSIS — Q210 Ventricular septal defect: Secondary | ICD-10-CM

## 2022-01-24 DIAGNOSIS — Z3A24 24 weeks gestation of pregnancy: Secondary | ICD-10-CM | POA: Insufficient documentation

## 2022-01-24 DIAGNOSIS — O35BXX0 Maternal care for other (suspected) fetal abnormality and damage, fetal cardiac anomalies, not applicable or unspecified: Secondary | ICD-10-CM

## 2022-02-16 ENCOUNTER — Other Ambulatory Visit: Payer: Self-pay

## 2022-02-16 ENCOUNTER — Encounter: Payer: Self-pay | Admitting: Obstetrics and Gynecology

## 2022-02-16 ENCOUNTER — Observation Stay
Admission: EM | Admit: 2022-02-16 | Discharge: 2022-02-16 | Disposition: A | Discharge status: Home / Self Care | Payer: Medicaid Other | Attending: Obstetrics and Gynecology | Admitting: Obstetrics and Gynecology

## 2022-02-16 DIAGNOSIS — Z3A27 27 weeks gestation of pregnancy: Secondary | ICD-10-CM | POA: Diagnosis not present

## 2022-02-16 DIAGNOSIS — R102 Pelvic and perineal pain unspecified side: Secondary | ICD-10-CM | POA: Diagnosis present

## 2022-02-16 DIAGNOSIS — Z87891 Personal history of nicotine dependence: Secondary | ICD-10-CM | POA: Insufficient documentation

## 2022-02-16 DIAGNOSIS — Z79899 Other long term (current) drug therapy: Secondary | ICD-10-CM | POA: Insufficient documentation

## 2022-02-16 DIAGNOSIS — Z98891 History of uterine scar from previous surgery: Secondary | ICD-10-CM | POA: Insufficient documentation

## 2022-02-16 DIAGNOSIS — O26892 Other specified pregnancy related conditions, second trimester: Secondary | ICD-10-CM | POA: Diagnosis present

## 2022-02-16 LAB — URINALYSIS, COMPLETE (UACMP) WITH MICROSCOPIC
Bilirubin Urine: NEGATIVE
Glucose, UA: NEGATIVE mg/dL
Hgb urine dipstick: NEGATIVE
Ketones, ur: NEGATIVE mg/dL
Leukocytes,Ua: NEGATIVE
Nitrite: NEGATIVE
Protein, ur: 30 mg/dL — AB
Specific Gravity, Urine: 1.024 (ref 1.005–1.030)
pH: 6 (ref 5.0–8.0)

## 2022-02-16 MED ORDER — ACETAMINOPHEN 500 MG PO TABS: 1000.0000 mg | ORAL_TABLET | Freq: Four times a day (QID) | ORAL | 0 refills | Status: AC | PRN

## 2022-02-16 MED ORDER — ACETAMINOPHEN 500 MG PO TABS
1000.0000 mg | ORAL_TABLET | Freq: Four times a day (QID) | ORAL | Status: DC | PRN
  Administered 2022-02-16: 1000 mg via ORAL
  Filled 2022-02-16: qty 2

## 2022-02-16 NOTE — Discharge Summary (Signed)
Veronica Walters is a 31 y.o. female. She is at [redacted]w[redacted]d gestation. Patient's last menstrual period was 08/09/2021 (exact date). 05/16/2022, Date entered prior to episode creation   Prenatal care site: [facility]  Chief complaint: pelvic pressure and intermittent sharp pain  HPI: Veronica Walters presents to L&D with complaints of pelvic pressure and intermittent sharp pain that rates a 7/10.   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 no CTX, no VB.no LOF,  Active fetal movement.   Maternal Medical History:  Past Medical Hx:  has a past medical history of Anemia and Herpes labialis.    Past Surgical Hx:  has a past surgical history that includes Therapeutic abortion (2006, 2010, 2013) and Cesarean section (N/A, 03/16/2015).   No Known Allergies   Prior to Admission medications   Medication Sig Start Date End Date Taking? Authorizing Provider  acetaminophen (TYLENOL) 500 MG tablet Take 500 mg by mouth every 6 (six) hours as needed.   Yes [provider]  Prenatal Vit-Fe Fumarate-FA (PRENATAL MULTIVITAMIN) TABS tablet Take 1 tablet by mouth daily at 12 noon.   Yes [provider]  gabapentin (NEURONTIN) 300 MG capsule Take 1 capsule (300 mg total) by mouth 3 (three) times daily. Patient not taking: Reported on 10/05/2021 07/15/21 07/15/22  Veronica Semen, MD    Social History: She  reports that she quit smoking about 10 months ago. Her smoking use included cigarettes. She quit smokeless tobacco use about 7 years ago. She reports that she does not currently use alcohol. She reports that she does not currently use drugs after having used the following drugs: Marijuana.  Family History: family history includes Hypertension in her mother. ,no history of gyn  cancers   Review of Systems: A full review of systems was performed and negative except as noted in the HPI.     Pertinent Results:  Prenatal Labs: Blood type/Rh A NEG   Antibody screen Negative    Rubella immune    Varicella Immune  RPR NR    HBsAg Neg   Hep C NR   HIV NR    GC neg  Chlamydia neg  Genetic screening cfDNA negative   1 hour GTT Not done  3 hour GTT Not done  GBS Not done      O:  BP (!) 107/55 (BP Location: Left Arm)   Pulse 71   Temp 98.3 F (36.8 C) (Oral)   Resp 18   Ht 5\' 3"  (1.6 m)   Wt 81.6 kg   LMP 08/09/2021 (Exact Date)   BMI 31.89 kg/m  Results for orders placed or performed during the hospital encounter of 02/16/22 (from the past 48 hour(s))  Urinalysis, Complete w Microscopic Urine, Clean Catch   Collection Time: 02/16/22  8:28 AM  Result Value Ref Range   Color, Urine YELLOW (A) YELLOW   APPearance HAZY (A) CLEAR   Specific Gravity, Urine 1.024 1.005 - 1.030   pH 6.0 5.0 - 8.0   Glucose, UA NEGATIVE NEGATIVE mg/dL   Hgb urine dipstick NEGATIVE NEGATIVE   Bilirubin Urine NEGATIVE NEGATIVE   Ketones, ur NEGATIVE NEGATIVE mg/dL   Protein, ur 30 (A) NEGATIVE mg/dL   Nitrite NEGATIVE NEGATIVE   Leukocytes,Ua NEGATIVE NEGATIVE   RBC / HPF 0-5 0 - 5 RBC/hpf   WBC, UA 0-5  0 - 5 WBC/hpf   Bacteria, UA RARE (A) NONE SEEN   Squamous Epithelial / LPF 11-20 0 - 5   Mucus PRESENT      Constitutional: NAD, AAOx3  HE/ENT: extraocular movements grossly intact, moist mucous membranes CV: RRR PULM: nl respiratory effort Abd: gravid, non-tender, non-distended, soft  Ext: Non-tender, Nonedmeatous Psych: mood appropriate, speech normal Pelvic : deferred SVE:      NST: Baseline FHR: 140 beats/min Variability: moderate Accelerations: present Decelerations: absent Tocometry: quiet  Interpretation:  INDICATIONS: triage for pelvic pressure and pain RESULTS:  A NST procedure was performed with FHR monitoring and a normal baseline  established, appropriate time of 20-40 minutes of evaluation, and accels >2 seen w 15x15 characteristics.  Results show a REACTIVE NST.   Assessment: 31 y.o. [id number] [redacted]w[redacted]d 05/16/2022, Date entered prior to episode creation   Principle diagnosis: Pelvic pressure in pregnancy, antepartum, third trimester [O26.893, R10.2] Pelvic pressure in pregnancy, antepartum, second trimester [O26.892, R10.2]   Plan: 1) Reactive NST  -Category 1 tracing  -Reassuring fetal status   2) Labor: Not present  -Discussed warning signs to return to L&D triage with  -Reviewed comfort measures   3) Disposition: discharge home stable -Precautions reviewed  -Follow up as scheduled  on 02/27/22 with CD  ----- [place] Certified Nurse Midwife [facility]

## 2022-02-16 NOTE — OB Triage Note (Signed)
Pt discharged home per order.   Pt stable and ambulatory and an After Visit Summary was printed and given to the patient. Discharge education completed with patient including follow up instructions, appointments, and medication list. Pt received labor and bleeding precautions. Patient able to verbalize understanding, all questions fully answered upon discharge. Tylenol given. Round ligament pain education maternal given.

## 2022-02-16 NOTE — Progress Notes (Signed)
Pt presents to L/D triage with reported mid/lower pelvic pressure and intermittent sharp pain. Pt reports pain is a 7/10 and is made worse with certain position changes and standing. Pt reports no bleeding, contractions, or LOF and positive fetal movement.  Monitors applied and assessing. VSS.  Patient is a previous C/S, [facility] patient.

## 2022-04-24 ENCOUNTER — Other Ambulatory Visit: Payer: Self-pay | Admitting: Obstetrics and Gynecology

## 2022-04-24 DIAGNOSIS — D509 Iron deficiency anemia, unspecified: Secondary | ICD-10-CM

## 2022-04-24 NOTE — Progress Notes (Signed)
Iron infusion orders

## 2022-04-25 ENCOUNTER — Ambulatory Visit
Admission: RE | Admit: 2022-04-25 | Discharge: 2022-04-25 | Disposition: A | Discharge status: Home / Self Care | Payer: Medicaid Other | Source: Ambulatory Visit | Attending: Obstetrics and Gynecology | Admitting: Obstetrics and Gynecology

## 2022-04-25 ENCOUNTER — Ambulatory Visit: Payer: Medicaid Other

## 2022-04-25 ENCOUNTER — Ambulatory Visit: Admission: RE | Admit: 2022-04-25 | Payer: Medicaid Other | Source: Ambulatory Visit

## 2022-04-25 DIAGNOSIS — D509 Iron deficiency anemia, unspecified: Secondary | ICD-10-CM | POA: Insufficient documentation

## 2022-04-25 DIAGNOSIS — O99019 Anemia complicating pregnancy, unspecified trimester: Secondary | ICD-10-CM | POA: Diagnosis present

## 2022-04-25 MED ORDER — SODIUM CHLORIDE 0.9 % IV SOLN
300.0000 mg | INTRAVENOUS | Status: DC
  Administered 2022-04-25: 300 mg via INTRAVENOUS
  Filled 2022-04-25: qty 300

## 2022-04-29 ENCOUNTER — Ambulatory Visit
Admission: RE | Admit: 2022-04-29 | Discharge: 2022-04-29 | Disposition: A | Discharge status: Home / Self Care | Payer: Medicaid Other | Source: Ambulatory Visit | Attending: Obstetrics and Gynecology | Admitting: Obstetrics and Gynecology

## 2022-04-29 ENCOUNTER — Ambulatory Visit: Payer: Medicaid Other

## 2022-04-29 DIAGNOSIS — O4590 Premature separation of placenta, unspecified, unspecified trimester: Secondary | ICD-10-CM | POA: Diagnosis present

## 2022-04-29 DIAGNOSIS — O4593 Premature separation of placenta, unspecified, third trimester: Secondary | ICD-10-CM | POA: Insufficient documentation

## 2022-04-29 DIAGNOSIS — Z3A00 Weeks of gestation of pregnancy not specified: Secondary | ICD-10-CM | POA: Diagnosis not present

## 2022-04-29 MED ORDER — SODIUM CHLORIDE 0.9 % IV SOLN
300.0000 mg | Freq: Once | INTRAVENOUS | Status: AC
  Administered 2022-04-29: 300 mg via INTRAVENOUS
  Filled 2022-04-29: qty 300

## 2022-05-02 ENCOUNTER — Observation Stay
Admission: EM | Admit: 2022-05-02 | Discharge: 2022-05-02 | Disposition: A | Discharge status: Home / Self Care | Payer: Medicaid Other | Attending: Obstetrics and Gynecology | Admitting: Obstetrics and Gynecology

## 2022-05-02 DIAGNOSIS — Z3A38 38 weeks gestation of pregnancy: Secondary | ICD-10-CM | POA: Insufficient documentation

## 2022-05-02 DIAGNOSIS — Z87891 Personal history of nicotine dependence: Secondary | ICD-10-CM | POA: Diagnosis not present

## 2022-05-02 DIAGNOSIS — O0993 Supervision of high risk pregnancy, unspecified, third trimester: Principal | ICD-10-CM | POA: Insufficient documentation

## 2022-05-02 DIAGNOSIS — O26893 Other specified pregnancy related conditions, third trimester: Secondary | ICD-10-CM | POA: Diagnosis present

## 2022-05-02 DIAGNOSIS — O471 False labor at or after 37 completed weeks of gestation: Secondary | ICD-10-CM | POA: Diagnosis present

## 2022-05-02 MED ORDER — ACETAMINOPHEN 500 MG PO TABS
1000.0000 mg | ORAL_TABLET | Freq: Once | ORAL | Status: AC
  Administered 2022-05-02: 1000 mg via ORAL

## 2022-05-02 MED ORDER — ACETAMINOPHEN 500 MG PO TABS
ORAL_TABLET | ORAL | Status: AC
  Filled 2022-05-02: qty 2

## 2022-05-02 NOTE — Discharge Summary (Addendum)
Veronica Walters is a 31 y.o. female. She is at [redacted]w[redacted]d gestation. Patient's last menstrual period was 08/09/2021 (exact date). Estimated Date of Delivery: 05/16/22  Prenatal care site: [facility]   Current pregnancy complicated by:  Prior CS for [facility]- stat CS for fetal bradycardia in active labor; desires elective repeat. (2 prior SVD) RH Negative Obesity  Chief complaint:painful tightening in her lower abdomen with pressure in her rectal area.  Denies LOF, VB, pain has worsened since this afternoon.     S: Resting comfortably. no CTX, no VB.no LOF,  Active fetal movement. Denies: HA, visual changes, SOB, or RUQ/epigastric pain  Maternal Medical History:   Past Medical History:  Diagnosis Date   Anemia    Herpes labialis     Past Surgical History:  Procedure Laterality Date   CESAREAN SECTION N/A 03/16/2015   Procedure: CESAREAN SECTION;  Surgeon: Conard Novak, MD;  Location: [facility] ORS;  Service: Obstetrics;  Laterality: N/A;   THERAPEUTIC ABORTION  2006, 2010, 2013    No Known Allergies  Prior to Admission medications   Medication Sig Start Date End Date Taking? Authorizing Provider  acetaminophen (TYLENOL) 500 MG tablet Take 2 tablets (1,000 mg total) by mouth every 6 (six) hours as needed for fever or headache. 02/16/22   Sonny Dandy, CNM  Prenatal Vit-Fe Fumarate-FA (PRENATAL MULTIVITAMIN) TABS tablet Take 1 tablet by mouth daily at 12 noon.    [provider]      Social History: She  reports that she quit smoking about 12 months ago. Her smoking use included cigarettes. She quit smokeless tobacco use about 7 years ago. She reports that she does not currently use alcohol. She reports that she does not currently use drugs after having used the following drugs: Marijuana.  Family History: family history includes Hypertension in her mother.   Review of Systems: A full review of systems was performed and negative except as  noted in the HPI.     O:  BP 131/85   Pulse 77   LMP 08/09/2021 (Exact Date)  No results found for this or any previous visit (from the past 48 hour(s)).   Constitutional: NAD, AAOx3  HE/ENT: extraocular movements grossly intact, moist mucous membranes CV: RRR PULM: nl respiratory effort, CTABL     Abd: gravid, non-tender, non-distended, soft      Ext: Non-tender, Nonedematous   Psych: mood appropriate, speech normal Pelvic: SVE- closed internal os, cervix long and soft, posterior. Unable to determine presenting part on SVE; cephalic by leopolds.   Fetal  monitoring: Cat I Appropriate for GA Baseline: 135bpm Variability: moderate Accelerations: absent/ present x >2 Decelerations absent  Tracing reviewed, 2 places with broken tracing, c/w tracing maternal HR  TOCO: occasional UCs  A/P: 31 y.o. [redacted]w[redacted]d here for antenatal surveillance for false labor  Principle Diagnosis:  High risk pregnancy in third trimester  Labor: not present.  Fetal Wellbeing: Reassuring Cat 1 tracing. Reactive NST  D/c home stable, precautions reviewed, follow-up as scheduled.    Randa Ngo, CNM 05/02/2022  8:35 PM

## 2022-05-02 NOTE — OB Triage Note (Signed)
Patient here for abdominal pain that started today. She feels very uncomfortable and is having leg swelling. She denies LOF and bleeing. C/S scheduled for next Thursday.

## 2022-05-03 ENCOUNTER — Encounter
Admission: RE | Admit: 2022-05-03 | Discharge: 2022-05-03 | Disposition: A | Discharge status: Home / Self Care | Payer: Medicaid Other | Source: Ambulatory Visit | Attending: Obstetrics and Gynecology | Admitting: Obstetrics and Gynecology

## 2022-05-03 ENCOUNTER — Other Ambulatory Visit: Payer: Self-pay

## 2022-05-03 ENCOUNTER — Encounter: Payer: Self-pay | Admitting: Urgent Care

## 2022-05-03 ENCOUNTER — Ambulatory Visit
Admission: RE | Admit: 2022-05-03 | Discharge: 2022-05-03 | Disposition: A | Discharge status: Home / Self Care | Payer: Medicaid Other | Source: Ambulatory Visit | Attending: Obstetrics and Gynecology | Admitting: Obstetrics and Gynecology

## 2022-05-03 DIAGNOSIS — Z3A00 Weeks of gestation of pregnancy not specified: Secondary | ICD-10-CM | POA: Insufficient documentation

## 2022-05-03 DIAGNOSIS — D509 Iron deficiency anemia, unspecified: Secondary | ICD-10-CM | POA: Insufficient documentation

## 2022-05-03 DIAGNOSIS — Z01812 Encounter for preprocedural laboratory examination: Secondary | ICD-10-CM | POA: Insufficient documentation

## 2022-05-03 DIAGNOSIS — O34219 Maternal care for unspecified type scar from previous cesarean delivery: Secondary | ICD-10-CM | POA: Insufficient documentation

## 2022-05-03 DIAGNOSIS — O99019 Anemia complicating pregnancy, unspecified trimester: Secondary | ICD-10-CM | POA: Diagnosis not present

## 2022-05-03 HISTORY — DX: Personal history of other medical treatment: Z92.89

## 2022-05-03 LAB — CBC
HCT: 30.3 % — ABNORMAL LOW (ref 36.0–46.0)
Hemoglobin: 9.5 g/dL — ABNORMAL LOW (ref 12.0–15.0)
MCH: 26 pg (ref 26.0–34.0)
MCHC: 31.4 g/dL (ref 30.0–36.0)
MCV: 83 fL (ref 80.0–100.0)
Platelets: 293 10*3/uL (ref 150–400)
RBC: 3.65 MIL/uL — ABNORMAL LOW (ref 3.87–5.11)
RDW: 18.6 % — ABNORMAL HIGH (ref 11.5–15.5)
WBC: 10.6 10*3/uL — ABNORMAL HIGH (ref 4.0–10.5)
nRBC: 0.2 % (ref 0.0–0.2)

## 2022-05-03 LAB — RAPID HIV SCREEN (HIV 1/2 AB+AG)
HIV 1/2 Antibodies: NONREACTIVE
HIV-1 P24 Antigen - HIV24: NONREACTIVE

## 2022-05-03 MED ORDER — SODIUM CHLORIDE 0.9 % IV SOLN
300.0000 mg | Freq: Once | INTRAVENOUS | Status: AC
  Administered 2022-05-03: 300 mg via INTRAVENOUS
  Filled 2022-05-03: qty 300

## 2022-05-03 NOTE — Patient Instructions (Addendum)
Your procedure is scheduled on: 05/09/22 Report to Arrive to the [facility] at 9 am. Veronica Walters Parking is available free of charge at the [employer].  Check in at the Admitting/Registrion desk before heading to the [facility] on the 3rd floor. Wheelchair and volunteer services are also available if needed If you have any questions regarding visitation etc you may contact the Birthplace at [phone]  Remember: Instructions that are not followed completely may result in serious medical risk, up to and including death, or upon the discretion of your surgeon and anesthesiologist your surgery may need to be rescheduled.     _X__ 1. Do not eat food or drink any liquids after midnight the night before your procedure.                 No gum chewing or hard candies.   __X__2.  On the morning of surgery brush your teeth with toothpaste and water, you                 may rinse your mouth with mouthwash if you wish.  Do not swallow any              toothpaste of mouthwash.     _X__ 3.  No Alcohol for 24 hours before or after surgery.   _X__ 4.  Do Not Smoke or use e-cigarettes For 24 Hours Prior to Your Surgery.                 Do not use any chewable tobacco products for at least 6 hours prior to                 surgery.  ____  5.  Bring all medications with you on the day of surgery if instructed.   __X__  6.  Notify your doctor if there is any change in your medical condition      (cold, fever, infections).     Do not wear jewelry, make-up, hairpins, clips or nail polish. Do not wear lotions, powders, or perfumes. You may wear deodorant Do not shave body hair 48 hours prior to surgery. Men may shave face and neck. Do not bring valuables to the hospital.    [facility] is not responsible for any belongings or valuables.  Contacts, dentures/partials or body piercings may not be worn into surgery. Bring a case for your contacts, glasses or hearing aids, a denture cup will be  supplied. Leave your suitcase in the car. After surgery it may be brought to your room. For patients admitted to the hospital, discharge time is determined by your treatment team.   Patients discharged the day of surgery will not be allowed to drive home.   Please read over the following fact sheets that you were given:   CHG soap  __X__ Take these medicines the morning of surgery with A SIP OF WATER:    1. none  2.   3.   4.  5.  6.  ____ Fleet Enema (as directed)   __X__ Use CHG Soap/SAGE wipes as directed  ____ Use inhalers on the day of surgery  ____ Stop metformin/Janumet/Farxiga 2 days prior to surgery    ____ Take 1/2 of usual insulin dose the night before surgery. No insulin the morning          of surgery.   ____ Stop Blood Thinners Coumadin/Plavix/Xarelto/Pleta/Pradaxa/Eliquis/Effient/Aspirin  on   Or contact your Surgeon, Cardiologist or Medical Doctor regarding  ability to  stop your blood thinners  __X__ Stop Anti-inflammatories 7 days before surgery such as Advil, Ibuprofen, Motrin,  BC or Goodies Powder, Naprosyn, Naproxen, Aleve, Aspirin    ____ Stop all herbals and supplements, fish oil or vitamins  until after surgery.    ____ Bring C-Pap to the hospital.

## 2022-05-04 LAB — RPR: RPR Ser Ql: NONREACTIVE

## 2022-05-08 ENCOUNTER — Ambulatory Visit
Admission: RE | Admit: 2022-05-08 | Discharge: 2022-05-08 | Disposition: A | Discharge status: Home / Self Care | Payer: Medicaid Other | Source: Ambulatory Visit | Attending: Obstetrics and Gynecology | Admitting: Obstetrics and Gynecology

## 2022-05-08 DIAGNOSIS — D509 Iron deficiency anemia, unspecified: Secondary | ICD-10-CM | POA: Insufficient documentation

## 2022-05-08 DIAGNOSIS — O99019 Anemia complicating pregnancy, unspecified trimester: Secondary | ICD-10-CM | POA: Insufficient documentation

## 2022-05-08 DIAGNOSIS — Z3A38 38 weeks gestation of pregnancy: Secondary | ICD-10-CM | POA: Insufficient documentation

## 2022-05-08 MED ORDER — SODIUM CHLORIDE 0.9 % IV SOLN
300.0000 mg | Freq: Once | INTRAVENOUS | Status: AC
  Administered 2022-05-08: 300 mg via INTRAVENOUS
  Filled 2022-05-08: qty 300

## 2022-05-09 ENCOUNTER — Encounter
Admission: RE | Disposition: A | Discharge status: Home / Self Care | Payer: Self-pay | Source: Ambulatory Visit | Attending: Obstetrics and Gynecology

## 2022-05-09 ENCOUNTER — Other Ambulatory Visit: Payer: Self-pay

## 2022-05-09 ENCOUNTER — Inpatient Hospital Stay
Admission: RE | Admit: 2022-05-09 | Discharge: 2022-05-12 | DRG: 787 | Disposition: A | Discharge status: Home / Self Care | Payer: Medicaid Other | Source: Ambulatory Visit | Attending: Obstetrics and Gynecology | Admitting: Obstetrics and Gynecology

## 2022-05-09 ENCOUNTER — Inpatient Hospital Stay: Payer: Medicaid Other | Admitting: Urgent Care

## 2022-05-09 ENCOUNTER — Encounter: Payer: Self-pay | Admitting: Obstetrics and Gynecology

## 2022-05-09 DIAGNOSIS — Z87891 Personal history of nicotine dependence: Secondary | ICD-10-CM

## 2022-05-09 DIAGNOSIS — O26893 Other specified pregnancy related conditions, third trimester: Secondary | ICD-10-CM | POA: Diagnosis present

## 2022-05-09 DIAGNOSIS — O34219 Maternal care for unspecified type scar from previous cesarean delivery: Principal | ICD-10-CM

## 2022-05-09 DIAGNOSIS — O3483 Maternal care for other abnormalities of pelvic organs, third trimester: Secondary | ICD-10-CM | POA: Diagnosis present

## 2022-05-09 DIAGNOSIS — N83209 Unspecified ovarian cyst, unspecified side: Secondary | ICD-10-CM | POA: Diagnosis present

## 2022-05-09 DIAGNOSIS — A6000 Herpesviral infection of urogenital system, unspecified: Secondary | ICD-10-CM | POA: Diagnosis present

## 2022-05-09 DIAGNOSIS — D62 Acute posthemorrhagic anemia: Secondary | ICD-10-CM | POA: Diagnosis not present

## 2022-05-09 DIAGNOSIS — O26899 Other specified pregnancy related conditions, unspecified trimester: Secondary | ICD-10-CM

## 2022-05-09 DIAGNOSIS — Z3A39 39 weeks gestation of pregnancy: Secondary | ICD-10-CM | POA: Diagnosis not present

## 2022-05-09 DIAGNOSIS — Z98891 History of uterine scar from previous surgery: Secondary | ICD-10-CM

## 2022-05-09 DIAGNOSIS — O9081 Anemia of the puerperium: Secondary | ICD-10-CM | POA: Diagnosis not present

## 2022-05-09 DIAGNOSIS — O34211 Maternal care for low transverse scar from previous cesarean delivery: Principal | ICD-10-CM | POA: Diagnosis present

## 2022-05-09 DIAGNOSIS — Z6791 Unspecified blood type, Rh negative: Secondary | ICD-10-CM | POA: Diagnosis not present

## 2022-05-09 DIAGNOSIS — O0990 Supervision of high risk pregnancy, unspecified, unspecified trimester: Secondary | ICD-10-CM

## 2022-05-09 DIAGNOSIS — O9832 Other infections with a predominantly sexual mode of transmission complicating childbirth: Secondary | ICD-10-CM | POA: Diagnosis present

## 2022-05-09 LAB — URINE DRUG SCREEN, QUALITATIVE (ARMC ONLY)
Amphetamines, Ur Screen: NOT DETECTED
Barbiturates, Ur Screen: NOT DETECTED
Benzodiazepine, Ur Scrn: NOT DETECTED
Cannabinoid 50 Ng, Ur ~~LOC~~: NOT DETECTED
Cocaine Metabolite,Ur ~~LOC~~: NOT DETECTED
MDMA (Ecstasy)Ur Screen: NOT DETECTED
Methadone Scn, Ur: NOT DETECTED
Opiate, Ur Screen: NOT DETECTED
Phencyclidine (PCP) Ur S: NOT DETECTED
Tricyclic, Ur Screen: NOT DETECTED

## 2022-05-09 LAB — CBC
HCT: 32 % — ABNORMAL LOW (ref 36.0–46.0)
Hemoglobin: 10.1 g/dL — ABNORMAL LOW (ref 12.0–15.0)
MCH: 26.2 pg (ref 26.0–34.0)
MCHC: 31.6 g/dL (ref 30.0–36.0)
MCV: 83.1 fL (ref 80.0–100.0)
Platelets: 267 10*3/uL (ref 150–400)
RBC: 3.85 MIL/uL — ABNORMAL LOW (ref 3.87–5.11)
RDW: 19.3 % — ABNORMAL HIGH (ref 11.5–15.5)
WBC: 9.6 10*3/uL (ref 4.0–10.5)
nRBC: 0 % (ref 0.0–0.2)

## 2022-05-09 SURGERY — Surgical Case
Anesthesia: Spinal

## 2022-05-09 MED ORDER — MENTHOL 3 MG MT LOZG
1.0000 | LOZENGE | OROMUCOSAL | Status: DC | PRN
  Filled 2022-05-09: qty 9

## 2022-05-09 MED ORDER — FERROUS SULFATE 325 (65 FE) MG PO TABS
325.0000 mg | ORAL_TABLET | Freq: Two times a day (BID) | ORAL | Status: DC
  Administered 2022-05-09 – 2022-05-12 (×7): 325 mg via ORAL
  Filled 2022-05-09 (×7): qty 1

## 2022-05-09 MED ORDER — SIMETHICONE 80 MG PO CHEW
80.0000 mg | CHEWABLE_TABLET | Freq: Three times a day (TID) | ORAL | Status: DC
  Administered 2022-05-09 – 2022-05-12 (×8): 80 mg via ORAL
  Filled 2022-05-09 (×8): qty 1

## 2022-05-09 MED ORDER — COCONUT OIL OIL: 1.0000 | TOPICAL_OIL | Status: DC | PRN

## 2022-05-09 MED ORDER — KETOROLAC TROMETHAMINE 30 MG/ML IJ SOLN
30.0000 mg | Freq: Four times a day (QID) | INTRAMUSCULAR | Status: AC | PRN
  Administered 2022-05-10: 30 mg via INTRAVENOUS
  Filled 2022-05-09: qty 1

## 2022-05-09 MED ORDER — PHENYLEPHRINE HCL-NACL 20-0.9 MG/250ML-% IV SOLN
INTRAVENOUS | Status: DC | PRN
  Administered 2022-05-09: 50 ug/min via INTRAVENOUS

## 2022-05-09 MED ORDER — OXYTOCIN-SODIUM CHLORIDE 30-0.9 UT/500ML-% IV SOLN
INTRAVENOUS | Status: AC
  Administered 2022-05-09: 2.5 [IU]/h via INTRAVENOUS
  Filled 2022-05-09: qty 1000

## 2022-05-09 MED ORDER — DIBUCAINE (PERIANAL) 1 % EX OINT: 1.0000 | TOPICAL_OINTMENT | CUTANEOUS | Status: DC | PRN

## 2022-05-09 MED ORDER — MORPHINE SULFATE (PF) 0.5 MG/ML IJ SOLN
INTRAMUSCULAR | Status: AC
  Filled 2022-05-09: qty 10

## 2022-05-09 MED ORDER — OXYTOCIN-SODIUM CHLORIDE 30-0.9 UT/500ML-% IV SOLN
INTRAVENOUS | Status: DC | PRN
  Administered 2022-05-09: 500 mL via INTRAVENOUS

## 2022-05-09 MED ORDER — BUPIVACAINE HCL (PF) 0.5 % IJ SOLN: 5.0000 mL | Freq: Once | INTRAMUSCULAR | Status: DC

## 2022-05-09 MED ORDER — LACTATED RINGERS IV SOLN: INTRAVENOUS | Status: DC

## 2022-05-09 MED ORDER — DIPHENHYDRAMINE HCL 25 MG PO CAPS
25.0000 mg | ORAL_CAPSULE | Freq: Four times a day (QID) | ORAL | Status: DC | PRN
  Administered 2022-05-10: 25 mg via ORAL
  Filled 2022-05-09: qty 1

## 2022-05-09 MED ORDER — FENTANYL CITRATE (PF) 100 MCG/2ML IJ SOLN
INTRAMUSCULAR | Status: AC
  Filled 2022-05-09: qty 2

## 2022-05-09 MED ORDER — DIPHENHYDRAMINE HCL 25 MG PO CAPS: 25.0000 mg | ORAL_CAPSULE | ORAL | Status: DC | PRN

## 2022-05-09 MED ORDER — DIPHENHYDRAMINE HCL 50 MG/ML IJ SOLN: 12.5000 mg | INTRAMUSCULAR | Status: DC | PRN

## 2022-05-09 MED ORDER — BUPIVACAINE HCL (PF) 0.5 % IJ SOLN
INTRAMUSCULAR | Status: DC | PRN
  Administered 2022-05-09: 10 mL

## 2022-05-09 MED ORDER — PHENYLEPHRINE HCL-NACL 20-0.9 MG/250ML-% IV SOLN
INTRAVENOUS | Status: AC
  Filled 2022-05-09: qty 250

## 2022-05-09 MED ORDER — LIDOCAINE HCL (PF) 1 % IJ SOLN
INTRAMUSCULAR | Status: DC | PRN
  Administered 2022-05-09: 3 mL

## 2022-05-09 MED ORDER — SCOPOLAMINE 1 MG/3DAYS TD PT72
1.0000 | MEDICATED_PATCH | Freq: Once | TRANSDERMAL | Status: AC
  Administered 2022-05-09: 1.5 mg via TRANSDERMAL
  Filled 2022-05-09: qty 1

## 2022-05-09 MED ORDER — SOD CITRATE-CITRIC ACID 500-334 MG/5ML PO SOLN
ORAL | Status: AC
  Administered 2022-05-09: 30 mL via ORAL
  Filled 2022-05-09: qty 15

## 2022-05-09 MED ORDER — OXYCODONE-ACETAMINOPHEN 5-325 MG PO TABS: 1.0000 | ORAL_TABLET | Freq: Four times a day (QID) | ORAL | Status: DC | PRN

## 2022-05-09 MED ORDER — BUPIVACAINE 0.25 % ON-Q PUMP DUAL CATH 400 ML: 400.0000 mL | INJECTION | Status: DC

## 2022-05-09 MED ORDER — BUPIVACAINE 0.25 % ON-Q PUMP DUAL CATH 400 ML
400.0000 mL | INJECTION | Status: DC
  Filled 2022-05-09: qty 400

## 2022-05-09 MED ORDER — NALOXONE HCL 0.4 MG/ML IJ SOLN: 0.4000 mg | INTRAMUSCULAR | Status: DC | PRN

## 2022-05-09 MED ORDER — SOD CITRATE-CITRIC ACID 500-334 MG/5ML PO SOLN: 30.0000 mL | ORAL | Status: AC

## 2022-05-09 MED ORDER — PRENATAL MULTIVITAMIN CH
1.0000 | ORAL_TABLET | Freq: Every day | ORAL | Status: DC
  Administered 2022-05-11 – 2022-05-12 (×2): 1 via ORAL
  Filled 2022-05-09 (×2): qty 1

## 2022-05-09 MED ORDER — KETOROLAC TROMETHAMINE 30 MG/ML IJ SOLN
INTRAMUSCULAR | Status: DC | PRN
  Administered 2022-05-09: 30 mg via INTRAVENOUS

## 2022-05-09 MED ORDER — WITCH HAZEL-GLYCERIN EX PADS: 1.0000 | MEDICATED_PAD | CUTANEOUS | Status: DC | PRN

## 2022-05-09 MED ORDER — IBUPROFEN 600 MG PO TABS
600.0000 mg | ORAL_TABLET | Freq: Four times a day (QID) | ORAL | Status: DC
  Administered 2022-05-10 – 2022-05-12 (×9): 600 mg via ORAL
  Filled 2022-05-09 (×9): qty 1

## 2022-05-09 MED ORDER — MORPHINE SULFATE (PF) 0.5 MG/ML IJ SOLN
INTRAMUSCULAR | Status: DC | PRN
  Administered 2022-05-09: 100 ug via INTRATHECAL

## 2022-05-09 MED ORDER — ONDANSETRON HCL 4 MG/2ML IJ SOLN
INTRAMUSCULAR | Status: DC | PRN
  Administered 2022-05-09: 4 mg via INTRAVENOUS

## 2022-05-09 MED ORDER — BUPIVACAINE HCL (PF) 0.5 % IJ SOLN
INTRAMUSCULAR | Status: AC
  Filled 2022-05-09: qty 10

## 2022-05-09 MED ORDER — FENTANYL CITRATE (PF) 100 MCG/2ML IJ SOLN
INTRAMUSCULAR | Status: DC | PRN
  Administered 2022-05-09: 15 ug via INTRATHECAL

## 2022-05-09 MED ORDER — BUPIVACAINE IN DEXTROSE 0.75-8.25 % IT SOLN
INTRATHECAL | Status: DC | PRN
  Administered 2022-05-09: 1.4 mL via INTRATHECAL

## 2022-05-09 MED ORDER — EPHEDRINE SULFATE (PRESSORS) 50 MG/ML IJ SOLN
INTRAMUSCULAR | Status: DC | PRN
  Administered 2022-05-09 (×2): 5 mg via INTRAVENOUS

## 2022-05-09 MED ORDER — MEPERIDINE HCL 25 MG/ML IJ SOLN: 6.2500 mg | INTRAMUSCULAR | Status: DC | PRN

## 2022-05-09 MED ORDER — OXYTOCIN-SODIUM CHLORIDE 30-0.9 UT/500ML-% IV SOLN: 2.5000 [IU]/h | INTRAVENOUS | Status: AC

## 2022-05-09 MED ORDER — OXYCODONE HCL 5 MG PO TABS
5.0000 mg | ORAL_TABLET | ORAL | Status: DC | PRN
  Administered 2022-05-09 – 2022-05-12 (×14): 5 mg via ORAL
  Filled 2022-05-09 (×14): qty 1

## 2022-05-09 MED ORDER — CEFAZOLIN SODIUM-DEXTROSE 2-4 GM/100ML-% IV SOLN
2.0000 g | INTRAVENOUS | Status: AC
  Administered 2022-05-09: 2 g via INTRAVENOUS
  Filled 2022-05-09: qty 100

## 2022-05-09 MED ORDER — SODIUM CHLORIDE 0.9% FLUSH: 3.0000 mL | INTRAVENOUS | Status: DC | PRN

## 2022-05-09 MED ORDER — ONDANSETRON HCL 4 MG/2ML IJ SOLN: 4.0000 mg | Freq: Three times a day (TID) | INTRAMUSCULAR | Status: DC | PRN

## 2022-05-09 MED ORDER — KETOROLAC TROMETHAMINE 30 MG/ML IJ SOLN: 30.0000 mg | Freq: Four times a day (QID) | INTRAMUSCULAR | Status: AC | PRN

## 2022-05-09 MED ORDER — SENNOSIDES-DOCUSATE SODIUM 8.6-50 MG PO TABS
2.0000 | ORAL_TABLET | ORAL | Status: DC
  Administered 2022-05-09 – 2022-05-12 (×3): 2 via ORAL
  Filled 2022-05-09 (×3): qty 2

## 2022-05-09 MED ORDER — PHENYLEPHRINE 80 MCG/ML (10ML) SYRINGE FOR IV PUSH (FOR BLOOD PRESSURE SUPPORT)
PREFILLED_SYRINGE | INTRAVENOUS | Status: DC | PRN
  Administered 2022-05-09 (×6): 80 ug via INTRAVENOUS

## 2022-05-09 MED ORDER — ACETAMINOPHEN 500 MG PO TABS
1000.0000 mg | ORAL_TABLET | Freq: Four times a day (QID) | ORAL | Status: AC
  Administered 2022-05-09 – 2022-05-10 (×4): 1000 mg via ORAL
  Filled 2022-05-09 (×5): qty 2

## 2022-05-09 MED ORDER — OXYCODONE-ACETAMINOPHEN 5-325 MG PO TABS: 2.0000 | ORAL_TABLET | Freq: Four times a day (QID) | ORAL | Status: DC | PRN

## 2022-05-09 MED ORDER — NALOXONE HCL 4 MG/10ML IJ SOLN: 1.0000 ug/kg/h | INTRAVENOUS | Status: DC | PRN

## 2022-05-09 SURGICAL SUPPLY — 31 items
CATH KIT ON-Q SILVERSOAK 5 (CATHETERS) ×2 IMPLANT
CATH KIT ON-Q SILVERSOAK 5IN (CATHETERS) ×2 IMPLANT
DERMABOND ADVANCED .7 DNX12 (GAUZE/BANDAGES/DRESSINGS) ×1 IMPLANT
DRSG OPSITE POSTOP 4X10 (GAUZE/BANDAGES/DRESSINGS) ×1 IMPLANT
DRSG TELFA 3X8 NADH STRL (GAUZE/BANDAGES/DRESSINGS) ×1 IMPLANT
ELECT CAUTERY BLADE 6.4 (BLADE) ×1 IMPLANT
ELECT REM PT RETURN 9FT ADLT (ELECTROSURGICAL) ×1
ELECTRODE REM PT RTRN 9FT ADLT (ELECTROSURGICAL) ×1 IMPLANT
GAUZE SPONGE 4X4 12PLY STRL (GAUZE/BANDAGES/DRESSINGS) ×1 IMPLANT
GLOVE BIO SURGEON STRL SZ7 (GLOVE) ×1 IMPLANT
GLOVE SURG UNDER LTX SZ7.5 (GLOVE) ×1 IMPLANT
GOWN STRL REUS W/ TWL LRG LVL3 (GOWN DISPOSABLE) ×3 IMPLANT
GOWN STRL REUS W/TWL LRG LVL3 (GOWN DISPOSABLE) ×3
MANIFOLD NEPTUNE II (INSTRUMENTS) ×1 IMPLANT
MAT PREVALON FULL STRYKER (MISCELLANEOUS) ×1 IMPLANT
NS IRRIG 1000ML POUR BTL (IV SOLUTION) ×1 IMPLANT
PACK C SECTION AR (MISCELLANEOUS) ×1 IMPLANT
PAD OB MATERNITY 4.3X12.25 (PERSONAL CARE ITEMS) ×2 IMPLANT
PAD PREP 24X41 OB/GYN DISP (PERSONAL CARE ITEMS) ×1 IMPLANT
SCRUB CHG 4% DYNA-HEX 4OZ (MISCELLANEOUS) ×1 IMPLANT
STRIP CLOSURE SKIN 1/2X4 (GAUZE/BANDAGES/DRESSINGS) ×1 IMPLANT
SUT MNCRL 4-0 (SUTURE) ×1
SUT MNCRL 4-0 27XMFL (SUTURE) ×1
SUT PDS AB 1 TP1 96 (SUTURE) ×1 IMPLANT
SUT PLAIN GUT 0 (SUTURE) IMPLANT
SUT VIC AB 0 CTX 36 (SUTURE) ×2
SUT VIC AB 0 CTX36XBRD ANBCTRL (SUTURE) ×2 IMPLANT
SUTURE MNCRL 4-0 27XMF (SUTURE) ×1 IMPLANT
SWABSTK COMLB BENZOIN TINCTURE (MISCELLANEOUS) ×1 IMPLANT
TRAP FLUID SMOKE EVACUATOR (MISCELLANEOUS) ×1 IMPLANT
WATER STERILE IRR 500ML POUR (IV SOLUTION) ×1 IMPLANT

## 2022-05-09 NOTE — Progress Notes (Signed)
Delivery QBL 210 Delivery EBL 500

## 2022-05-09 NOTE — Anesthesia Preprocedure Evaluation (Addendum)
Anesthesia Evaluation  Patient identified by MRN, date of birth, ID band Patient awake    Reviewed: Allergy & Precautions, NPO status , Patient's Chart, lab work & pertinent test results  History of Anesthesia Complications Negative for: history of anesthetic complications  Airway Mallampati: III   Neck ROM: Full    Dental  (+) Missing   Pulmonary former smoker (quit 04/2021)   Pulmonary exam normal breath sounds clear to auscultation       Cardiovascular Exercise Tolerance: Good negative cardio ROS Normal cardiovascular exam Rhythm:Regular Rate:Normal     Neuro/Psych negative neurological ROS     GI/Hepatic negative GI ROS,,,  Endo/Other  negative endocrine ROS    Renal/GU negative Renal ROS     Musculoskeletal   Abdominal   Peds  Hematology  (+) Blood dyscrasia, anemia   Anesthesia Other Findings 31 yo [id number] presenting for elective repeat c-section.  Fetus with VSD.  Reproductive/Obstetrics                             Anesthesia Physical Anesthesia Plan  ASA: 2  Anesthesia Plan: Spinal   Post-op Pain Management:    Induction:   PONV Risk Score and Plan: 2 and Ondansetron and Treatment may vary due to age or medical condition  Airway Management Planned: Natural Airway and Nasal Cannula  Additional Equipment:   Intra-op Plan:   Post-operative Plan:   Informed Consent: I have reviewed the patients History and Physical, chart, labs and discussed the procedure including the risks, benefits and alternatives for the proposed anesthesia with the patient or authorized representative who has indicated his/her understanding and acceptance.       Plan Discussed with: CRNA  Anesthesia Plan Comments: (Plan for spinal; GETA backup.  Patient consented for risks of anesthesia including but not limited to:  - adverse reactions to medications - damage to eyes, teeth, lips or other  oral mucosa - nerve damage due to positioning  - sore throat or hoarseness - headache, bleeding, infection, nerve damage 2/2 spinal - damage to heart, brain, nerves, lungs, other parts of body or loss of life  Informed patient about role of CRNA in peri- and intra-operative care.  Patient voiced understanding.)       Anesthesia Quick Evaluation

## 2022-05-09 NOTE — Transfer of Care (Signed)
Immediate Anesthesia Transfer of Care Note  Patient: Veronica Walters  Procedure(s) Performed: REPEAT CESAREAN SECTION  Patient Location: Mother/Baby  Anesthesia Type:Spinal  Level of Consciousness: awake, alert , and oriented  Airway & Oxygen Therapy: Patient Spontanous Breathing  Post-op Assessment: Report given to RN and Post -op Vital signs reviewed and stable  Post vital signs: Reviewed and stable  Last Vitals:  Vitals Value Taken Time  BP 104/57   Temp    Pulse 67   Resp 12   SpO2 100     Last Pain:  Vitals:   05/09/22 0904  TempSrc: Oral  PainSc: 0-No pain         Complications: No notable events documented.

## 2022-05-09 NOTE — H&P (Signed)
OB History & Physical   History of Present Illness:  Chief Complaint: Presents for repeat cesarean delivery  HPI:  Veronica Walters is a 31 y.o. [phone] female at [redacted]w[redacted]d dated by LMP consistent with a 12 week ultrasound.  Her pregnancy has been complicated by a 7 cm ovarian cyst that was 2 cm on repeat ultrasound, genital herpes, Rh negative, history of cesarean section, marginal placenta on 16 week ultrasound that has now resolved  She had a her first c-section in 2016 for fetal intolerance of labor and placental abruption.  She has been anemic this pregnancy with a hemoglobin of 9. She tried iron pills and doesn't tolerate them well.  She has been receiving iron infusions and this has increased her hemoglobin levels to above 10.  She is having a baby boy, "[name]."      She has a history of report VSD and underwent a fetal echo on 01/11/2022 at [facility].   Summary for Fetus (from report):   1. Patent foramen ovale.   2. 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   3. No VSD appreciated on today's study.   4. Normal left ventricular cavity size and systolic function.   5. Normal right ventricular cavity size and systolic function.   6. Sinus with 1:1 AV conduction.   She denies contractions.   She denies leakage of fluid.   She denies vaginal bleeding.   She reports fetal movement.    Total weight gain for pregnancy: Not found.   Obstetrical Problem List: pregnacy Problems (from 12/27/21 to present)     No problems associated with this episode.        Maternal Medical History:   Past Medical History:  Diagnosis Date   Anemia    Herpes labialis    Hx of transfusion of whole blood     Past Surgical History:  Procedure Laterality Date   CESAREAN SECTION N/A 03/16/2015   Procedure: CESAREAN SECTION;  Surgeon: Conard Novak, MD;  Location: [facility] ORS;  Service: Obstetrics;  Laterality: N/A;   THERAPEUTIC ABORTION  2006, 2010, 2013    No  Known Allergies  Prior to Admission medications   Medication Sig Start Date End Date Taking? Authorizing Provider  IRON, FERROUS SULFATE, PO Take 1 tablet by mouth every other day.   Yes [provider]  Prenatal Vit-Fe Fumarate-FA (PRENATAL MULTIVITAMIN) TABS tablet Take 1 tablet by mouth daily at 12 noon.   Yes [provider]  acetaminophen (TYLENOL) 500 MG tablet Take 2 tablets (1,000 mg total) by mouth every 6 (six) hours as needed for fever or headache. 02/16/22   Sonny Dandy, CNM    OB History  Gravida Para Term Preterm AB Living  8 3 3  0 4 3  SAB IAB Ectopic Multiple Live Births  0 1   0 3    # Outcome Date GA Lbr Len/2nd Weight Sex Delivery Anes PTL Lv  8 Current           7 IAB 2017          6 Term 03/16/15 [redacted]w[redacted]d  3530 g F CS-LTranv Gen  LIV  5 AB 04/30/12          4 Term 09/12/10   3799 g F Vag-Spont   LIV  3 AB 01/29/09 [redacted]w[redacted]d         2 Term 09/20/08 [redacted]w[redacted]d  2722 g F    LIV  1 AB 02/28/05  Prenatal care site: [facility]  Social History: She  reports that she quit smoking about 12 months ago. Her smoking use included cigarettes. She quit smokeless tobacco use about 7 years ago. She reports that she does not currently use alcohol. She reports that she does not currently use drugs after having used the following drugs: Marijuana.  Family History: family history includes Hypertension in her mother.   Review of Systems:  Review of Systems  Constitutional: Negative.   HENT: Negative.    Eyes: Negative.   Respiratory: Negative.    Cardiovascular: Negative.   Gastrointestinal: Negative.   Genitourinary: Negative.   Musculoskeletal: Negative.   Skin: Negative.   Neurological: Negative.   Psychiatric/Behavioral: Negative.       Physical Exam:  BP 122/83 (BP Location: Left Arm)   Pulse 90   Temp 98 F (36.7 C) (Oral)   Resp 17   Ht 5\' 3"  (1.6 m)   Wt 90.3 kg   LMP 08/09/2021 (Exact Date)   BMI 35.25 kg/m    Physical Exam Constitutional:      General: She is not in acute distress.    Appearance: Normal appearance. She is well-developed.  HENT:     Head: Normocephalic and atraumatic.  Eyes:     General: No scleral icterus.    Conjunctiva/sclera: Conjunctivae normal.  Cardiovascular:     Rate and Rhythm: Normal rate and regular rhythm.     Heart sounds: No murmur heard.    No friction rub. No gallop.  Pulmonary:     Effort: Pulmonary effort is normal. No respiratory distress.     Breath sounds: Normal breath sounds. No wheezing or rales.  Abdominal:     General: Bowel sounds are normal. There is no distension.     Palpations: Abdomen is soft. There is mass (gravid, nt).     Tenderness: There is no abdominal tenderness. There is no guarding or rebound.  Musculoskeletal:        General: Normal range of motion.     Cervical back: Normal range of motion and neck supple.  Neurological:     General: No focal deficit present.     Mental Status: She is alert and oriented to person, place, and time.     Cranial Nerves: No cranial nerve deficit.  Skin:    General: Skin is warm and dry.     Findings: No erythema.  Psychiatric:        Mood and Affect: Mood normal.        Behavior: Behavior normal.        Judgment: Judgment normal.      Pertinent Results:   Blood type/Rh A negative  Antibody screen Positive anti-D weak, received Rhogam around 12 weeks  Rubella Immune  Varicella Immune    RPR NR  HBsAg negative  HIV negative  GC negative  Chlamydia negative  Genetic screening unsure  1 hour GTT States that she failed 1 hour, reports passing 3 hour gtt  3 hour GTT Passed per patient  GBS unknown   CBC    Component Value Date/Time   WBC 9.6 05/09/2022 0929   RBC 3.85 (L) 05/09/2022 0929   HGB 10.1 (L) 05/09/2022 0929   HCT 32.0 (L) 05/09/2022 0929   PLT 267 05/09/2022 0929   MCV 83.1 05/09/2022 0929   MCH 26.2 05/09/2022 0929   MCHC 31.6 05/09/2022 0929   RDW 19.3 (H)  05/09/2022 0929     Baseline FHR: 135 beats/min  Variability: moderate   Accelerations: present   Decelerations: absent Contractions: irritability frequency: irregular Overall assessment: cat 1  Assessment:  Veronica Walters is a 31 y.o. [phone] female at [redacted]w[redacted]d with history of cesarean delivery, desires repeat.   Plan:  Admit to Labor & Delivery  CBC, T&S, NPO, IVF GBS uknown, records not available.   Fetwal well-being: reassuring Patient will undergo surgical management with a repeat cesarean delivery. The risks of surgery were discussed in detail with the patient including but not limited to: bleeding which may require transfusion or reoperation; infection which may require antibiotics; injury to surrounding organs which may involve bowel, bladder, ureters ; need for additional procedures including laparoscopy or laparotomy; thromboembolic phenomenon, surgical site problems and other postoperative/anesthesia complications. Likelihood of success in alleviating the patient's condition was discussed. Routine postoperative instructions will be reviewed with the patient and her family in detail after surgery.  The patient concurred with the proposed plan, giving informed written consent for the surgery.  Patient has been NPO since last night she will remain NPO for procedure.  Anesthesia and OR aware.  Preoperative prophylactic antibiotics, as indicated, and SCDs ordered on call to the OR.  To OR when ready.    Thomasene Mohair, MD 05/09/2022 10:16 AM

## 2022-05-09 NOTE — Anesthesia Procedure Notes (Signed)
Spinal  Patient location during procedure: OR Start time: 05/09/2022 11:18 AM End time: 05/09/2022 11:21 AM Reason for block: surgical anesthesia Staffing Performed: resident/CRNA  Anesthesiologist: Darrin Nipper, MD Resident/CRNA: Norm Salt, CRNA Performed by: Lily Peer, Anael Rosch, CRNA Authorized by: Lily Peer, Derico Mitton, CRNA   Preanesthetic Checklist Completed: patient identified, IV checked, site marked, risks and benefits discussed, surgical consent, monitors and equipment checked and pre-op evaluation Spinal Block Patient position: sitting Prep: ChloraPrep Patient monitoring: heart rate, continuous pulse ox and blood pressure Approach: midline Location: L3-4 Injection technique: single-shot Needle Needle type: Pencan  Needle gauge: 24 G Needle length: 10 cm Assessment Events: CSF return Additional Notes IV functioning, monitors applied to pt. Expiration date of kit checked and confirmed to be in date. Sterile prep and drape, hand hygiene and sterile gloved used. Pt was positioned and spine was prepped in sterile fashion. Skin was anesthetized with lidocaine. Free flow of clear CSF obtained prior to injecting local anesthetic into CSF x 1 attempt. Spinal needle aspirated freely following injection. Needle was carefully withdrawn, and pt tolerated procedure well. Loss of motor and sensory on exam post injection.

## 2022-05-09 NOTE — Hospital Course (Signed)
Risk assessment for postpartum VTE and prophylactic treatment: Very high risk factors: None High risk factors: None Moderate risk factors: Cesarean delivery  and BMI 30-40 kg/m2  Postpartum VTE prophylaxis with LMWH not indicated  

## 2022-05-09 NOTE — Lactation Note (Signed)
This note was copied from a baby's chart. Lactation Consultation Note  Patient Name: Veronica Walters BOERQ'S Date: 05/09/2022 Reason for consult: Initial assessment;Term;Other (Comment) (repeat c-section) Age:32 hours  Maternal Data Has patient been taught Hand Expression?: Yes Does the patient have breastfeeding experience prior to this delivery?: Yes How long did the patient breastfeed?: "a little while" (with all other children)  P4, repeat c-section. Hx of anemia. Mom has breastfed with other children (youngest is 62 years old). Desires breastfeeding with [name].  Feeding Mother's Current Feeding Choice: Breast Milk and Formula  Baby initially fed after delivery in L&D with Transition RN, and through hand expression into mouth.  LATCH Score Latch: Repeated attempts needed to sustain latch, nipple held in mouth throughout feeding, stimulation needed to elicit sucking reflex.  Audible Swallowing: Spontaneous and intermittent  Type of Nipple: Everted at rest and after stimulation  Comfort (Breast/Nipple): Soft / non-tender  Hold (Positioning): Assistance needed to correctly position infant at breast and maintain latch.  LATCH Score: 8  Baby crying upon entry of room. Mom voices that she wants to try again. Baby placed in football hold on R breast. Mom assisting with position and sandwiching of breast tissue. Several attempts made but baby did not sustain latch. Blanket removed and brought back to mom with LC more hands on to assist with position/latch. Baby latched and sustained latch with rhythmic suck pattern and audible swallows. Left baby in this position actively feeding after additional 5 minutes of observation.  Lactation Tools Discussed/Used    Interventions Interventions: Breast feeding basics reviewed;Assisted with latch;Hand express;Support pillows;Adjust position;Education  Reviewed basic BF education. Reviewed normal newborn behaviors and feeding patterns,  feeding on demand and with early cues. Encouraged skin to skin as tolerated to help with adjusting to post delivery. Reviewed position and alignment of baby, hand expressing colostrum to encourage wide mouth. Recommended 8-12 feeding attempts within the first 24 hours, with option to hand express/spoon feed if baby is sleepy. Output expectations for first 24 hours reviewed.  Discharge Pump: Personal  Consult Status Consult Status: Follow-up  Whiteboard updated with LC name/number.  Danford Bad 05/09/2022, 3:51 PM

## 2022-05-09 NOTE — Discharge Summary (Signed)
Postpartum Discharge Summary     Patient Name: Veronica Walters DOB: 1990-12-06 MRN: [id number]  Date of admission: 05/09/2022 Delivery date:05/09/2022  Delivering provider: Thomasene Mohair D  Date of discharge: 05/12/2022  Admitting diagnosis: History of cesarean delivery [Z98.891] Intrauterine pregnancy: [redacted]w[redacted]d     Secondary diagnosis:  Principal Problem:   History of cesarean delivery Active Problems:   Supervision of high-risk pregnancy   Rh negative state in antepartum period   [redacted] weeks gestation of pregnancy  Additional problems: none    Discharge diagnosis: Term Pregnancy Delivered                                              Post partum procedures:blood transfusion, curettage , and JADA Augmentation: N/A Complications: None  Hospital course: Sceduled C/S   31 y.o. yo [name] at [redacted]w[redacted]d was admitted to the hospital 05/09/2022 for scheduled cesarean section with the following indication:Elective Repeat.Delivery details are as follows:  Membrane Rupture Time/Date: 11:48 AM ,05/09/2022   Delivery Method:C-Section, Low Transverse  Details of operation can be found in separate operative note.  Patient had a postpartum course complicated by delayed postpartum hemorrhage that required D&C in OR and blood transfusion of 2 units pRBC.  Please see OP note for further details. She is ambulating, tolerating a regular diet, passing flatus, and urinating well. Patient is discharged home in stable condition on  05/12/22        Newborn Data: Birth date:05/09/2022  Birth time:11:49 AM  Gender:Female  "[place]" Living status:Living  Apgars:8 ,9  Weight:3360 g     Magnesium Sulfate received: No BMZ received: No Rhophylac:N/A MMR:N/A Transfusion:Yes - 2 units pRBC  Physical exam  Vitals:   05/09/22 0904 05/12/22 0028 05/12/22 0443 05/12/22 0807  BP:  (!) 128/97 114/63 115/87  Pulse:  65 69 73  Resp:  18 18 17   Temp:  98.3 F (36.8 C) 98 F (36.7 C) 98 F (36.7 C)   TempSrc:  Oral Oral Oral  SpO2:  100% 99% 99%  Weight: 90.3 kg     Height: 5\' 3"  (1.6 m)      General: alert, cooperative, and no distress Lochia: appropriate Uterine Fundus: firm Incision: Healing well with no significant drainage, No significant erythema, Dressing is clean, dry, and intact DVT Evaluation: No evidence of DVT seen on physical exam. Labs: Lab Results  Component Value Date   WBC 19.2 (H) 05/11/2022   HGB 8.4 (L) 05/11/2022   HCT 25.3 (L) 05/11/2022   MCV 83.8 05/11/2022   PLT 210 05/11/2022      Latest Ref Rng & Units 05/11/2022    7:19 AM  CMP  Glucose 70 - 99 mg/dL 86   BUN 6 - 20 mg/dL 5   Creatinine 1.30 - 8.65 mg/dL 7.84   Sodium 696 - 295 mmol/L 135   Potassium 3.5 - 5.1 mmol/L 4.2   Chloride 98 - 111 mmol/L 107   CO2 22 - 32 mmol/L 25   Calcium 8.9 - 10.3 mg/dL 7.9   Total Protein 6.5 - 8.1 g/dL 5.0   Total Bilirubin 0.3 - 1.2 mg/dL 0.7   Alkaline Phos 38 - 126 U/L 231   AST 15 - 41 U/L 25   ALT 0 - 44 U/L 14    Edinburgh Score:    05/12/2022    7:03 AM  Inocente Salles Postnatal  Depression Scale Screening Tool  I have been able to laugh and see the funny side of things. 0  I have looked forward with enjoyment to things. 0  I have blamed myself unnecessarily when things went wrong. 0  I have been anxious or worried for no good reason. 0  I have felt scared or panicky for no good reason. 0  Things have been getting on top of me. 0  I have been so unhappy that I have had difficulty sleeping. 0  I have felt sad or miserable. 0  I have been so unhappy that I have been crying. 0  The thought of harming myself has occurred to me. 0  Edinburgh Postnatal Depression Scale Total 0      After visit meds:  Allergies as of 05/12/2022   No Known Allergies      Medication List     TAKE these medications    acetaminophen 500 MG tablet Commonly known as: TYLENOL Take 2 tablets (1,000 mg total) by mouth every 6 (six) hours as needed for fever or  headache.   ferrous sulfate 325 (65 FE) MG tablet Take 1 tablet (325 mg total) by mouth 2 (two) times daily with a meal. What changed:  medication strength how much to take when to take this   ibuprofen 800 MG tablet Commonly known as: ADVIL Take 1 tablet (800 mg total) by mouth every 8 (eight) hours as needed.   oxyCODONE 5 MG immediate release tablet Commonly known as: Oxy IR/ROXICODONE Take 1 tablet (5 mg total) by mouth every 4 (four) hours as needed for up to 7 days for breakthrough pain (pain score 5-10).   prenatal multivitamin Tabs tablet Take 1 tablet by mouth daily at 12 noon.       Risk assessment for postpartum VTE and prophylactic treatment: Very high risk factors: None High risk factors: None Moderate risk factors: Cesarean delivery  and BMI 30-40 kg/m2  Postpartum VTE prophylaxis with LMWH not indicated   Discharge home in stable condition Infant Feeding: Bottle Infant Disposition:home with mother Discharge instruction: per After Visit Summary and Postpartum booklet. Activity: Advance as tolerated. Pelvic rest for 6 weeks.  Diet: routine diet Anticipated Birth Control: IUD Postpartum Appointment:6 weeks Additional Postpartum F/U: Incision check 1 week Future Appointments:No future appointments. Follow up Visit:  Follow-up Information     Conard Novak, MD. Schedule an appointment as soon as possible for a visit in 1 week(s).   Specialty: Obstetrics and Gynecology Why: For incision check Contact information: [address] [place] [address] [phone]                SIGNED: Janyce Llanos, CNM 05/12/2022

## 2022-05-09 NOTE — Op Note (Signed)
Cesarean Section Operative Note    Patient Name: Veronica Walters  Date of Birth: 1991/03/23  MRN: [id number]  Date of Surgery: 05/09/2022   Pre-operative Diagnosis:  1) History of prior cesarean delivery, desires repeat 2) intrauterine pregnancy at [redacted]w[redacted]d   Post-operative Diagnosis:  1) History of prior cesarean delivery, desires repeat 2) intrauterine pregnancy at [redacted]w[redacted]d    Procedure: Repeat Low Transverse Cesarean Section via Pfannenstiel incision with double layer uterine closure  Surgeon: Surgeon(s) and Role:    Conard Novak, MD - Primary   Assistants: Chari Manning, CNM; No other capable assistant available, in surgery requiring high level assistant.  Anesthesia: spinal   Findings:  1) normal appearing gravid uterus, fallopian tubes, and ovaries 2) viable female infant with weight of 3,360 grams and APGARs 8 and 9   Estimated Blood Loss: 500 mL Quantified Blood Loss: 210 mL  Total IV Fluids: 1,200 ml   Urine Output:  100 mL  Specimens: none  Complications: no complications  Disposition: PACU - hemodynamically stable.   Maternal Condition: stable   Baby condition / location:  Couplet care / Skin to Skin  Procedure Details:  The patient was seen in the Holding Room. The risks, benefits, complications, treatment options, and expected outcomes were discussed with the patient. The patient concurred with the proposed plan, giving informed consent. identified as Veronica Walters and the procedure verified as C-Section Delivery. A Time Out was held and the above information confirmed.   After induction of anesthesia, the patient was draped and prepped in the usual sterile manner. A Pfannenstiel incision was made and carried down through the subcutaneous tissue to the fascia. Fascial incision was made and extended transversely. The fascia was separated from the underlying rectus tissue superiorly and inferiorly. The peritoneum was identified  and entered. Peritoneal incision was extended longitudinally. The bladder flap was bluntly and sharply freed from the lower uterine segment. A low transverse uterine incision was made and the hysterotomy was extended with cranial-caudal tension. Delivered from cephalic presentation was a 3,360 gram Living newborn infant(s) or Female with Apgar scores of 8 at one minute and 9 at five minutes. Cord ph was  not sent  the umbilical cord was clamped and cut cord blood was obtained for evaluation. The placenta was removed Intact and appeared normal. The uterine outline, tubes and ovaries appeared normal. The uterine incision was closed with running locked sutures of 0 Vicryl.  A second layer of the same suture was thrown in an imbricating fashion.  Hemostasis was assured.  The uterus was returned to the abdomen and the paracolic gutters were cleared of all clots and debris.  The rectus muscles were inspected and found to be hemostatic.  The On-Q catheter pumps were inserted in accordance with the manufacturer's recommendations.  The catheters were inserted approximately 4cm cephelad to the incision line, approximately 1cm apart, straddling the midline.  They were inserted to a depth of the 4th mark. They were positioned superficial to the rectus abdominus muscles and deep to the rectus fascia.    The fascia was then reapproximated with running sutures of 1-0 PDS, looped. The subcuticular closure was performed using 4-0 monocryl. The skin closure was reinforced using surgical skin glue.  The On-Q catheters were bolused with 5 mL of 0.5% marcaine plain for a total of 10 mL.  The catheters were affixed to the skin with surgical skin glue, steri-strips, and tegaderm.    The surgical assistant performed tissue  retraction, assistance with suturing, and fundal pressure.  Instrument, sponge, and needle counts were correct prior the abdominal closure and were correct at the conclusion of the case.  The patient received  Ancef 2 gram IV prior to skin incision (within 30 minutes). For VTE prophylaxis she was wearing SCDs throughout the case.  The assistant surgeon was an CNM due to lack of availability of another Sales promotion account executive.    Signed: Conard Novak, MD 05/09/2022 12:35 PM

## 2022-05-10 ENCOUNTER — Other Ambulatory Visit: Payer: Self-pay

## 2022-05-10 ENCOUNTER — Inpatient Hospital Stay: Payer: Medicaid Other | Admitting: Certified Registered"

## 2022-05-10 ENCOUNTER — Encounter: Payer: Self-pay | Admitting: Obstetrics and Gynecology

## 2022-05-10 ENCOUNTER — Encounter
Admission: RE | Disposition: A | Discharge status: Home / Self Care | Payer: Self-pay | Source: Ambulatory Visit | Attending: Obstetrics and Gynecology

## 2022-05-10 HISTORY — PX: DILATION AND EVACUATION: SHX1459

## 2022-05-10 LAB — CBC
HCT: 27.7 % — ABNORMAL LOW (ref 36.0–46.0)
HCT: 23.3 % — ABNORMAL LOW (ref 36.0–46.0)
Hemoglobin: 8.8 g/dL — ABNORMAL LOW (ref 12.0–15.0)
Hemoglobin: 7.4 g/dL — ABNORMAL LOW (ref 12.0–15.0)
MCH: 26.4 pg (ref 26.0–34.0)
MCH: 26.5 pg (ref 26.0–34.0)
MCHC: 31.8 g/dL (ref 30.0–36.0)
MCHC: 31.8 g/dL (ref 30.0–36.0)
MCV: 83.2 fL (ref 80.0–100.0)
MCV: 83.5 fL (ref 80.0–100.0)
Platelets: 255 10*3/uL (ref 150–400)
Platelets: 231 10*3/uL (ref 150–400)
RBC: 3.33 MIL/uL — ABNORMAL LOW (ref 3.87–5.11)
RBC: 2.79 MIL/uL — ABNORMAL LOW (ref 3.87–5.11)
RDW: 19.2 % — ABNORMAL HIGH (ref 11.5–15.5)
RDW: 19.4 % — ABNORMAL HIGH (ref 11.5–15.5)
WBC: 20.5 10*3/uL — ABNORMAL HIGH (ref 4.0–10.5)
WBC: 19.7 10*3/uL — ABNORMAL HIGH (ref 4.0–10.5)
nRBC: 0.1 % (ref 0.0–0.2)
nRBC: 0.2 % (ref 0.0–0.2)

## 2022-05-10 LAB — FETAL SCREEN: Fetal Screen: NEGATIVE

## 2022-05-10 LAB — PREPARE RBC (CROSSMATCH)

## 2022-05-10 SURGERY — DILATION AND EVACUATION, UTERUS
Anesthesia: General

## 2022-05-10 MED ORDER — SODIUM CHLORIDE 0.9% IV SOLUTION: Freq: Once | INTRAVENOUS | Status: DC

## 2022-05-10 MED ORDER — TRANEXAMIC ACID-NACL 1000-0.7 MG/100ML-% IV SOLN
1000.0000 mg | Freq: Once | INTRAVENOUS | Status: AC
  Administered 2022-05-10: 1000 mg via INTRAVENOUS
  Filled 2022-05-10: qty 100

## 2022-05-10 MED ORDER — PROPOFOL 10 MG/ML IV BOLUS
INTRAVENOUS | Status: AC
  Filled 2022-05-10: qty 20

## 2022-05-10 MED ORDER — MIDAZOLAM HCL 2 MG/2ML IJ SOLN
INTRAMUSCULAR | Status: AC
  Filled 2022-05-10: qty 2

## 2022-05-10 MED ORDER — DEXAMETHASONE SODIUM PHOSPHATE 10 MG/ML IJ SOLN
INTRAMUSCULAR | Status: DC | PRN
  Administered 2022-05-10: 10 mg via INTRAVENOUS

## 2022-05-10 MED ORDER — FENTANYL CITRATE (PF) 100 MCG/2ML IJ SOLN
25.0000 ug | INTRAMUSCULAR | Status: DC | PRN
  Administered 2022-05-10 (×3): 25 ug via INTRAVENOUS

## 2022-05-10 MED ORDER — SODIUM CHLORIDE 0.9 % IV SOLN
300.0000 mg | Freq: Once | INTRAVENOUS | Status: AC
  Administered 2022-05-10: 300 mg via INTRAVENOUS
  Filled 2022-05-10: qty 300

## 2022-05-10 MED ORDER — MISOPROSTOL 200 MCG PO TABS
ORAL_TABLET | ORAL | Status: AC
  Filled 2022-05-10: qty 4

## 2022-05-10 MED ORDER — FENTANYL CITRATE (PF) 100 MCG/2ML IJ SOLN
INTRAMUSCULAR | Status: AC
  Administered 2022-05-10: 25 ug via INTRAVENOUS
  Filled 2022-05-10: qty 2

## 2022-05-10 MED ORDER — PHENYLEPHRINE 80 MCG/ML (10ML) SYRINGE FOR IV PUSH (FOR BLOOD PRESSURE SUPPORT)
PREFILLED_SYRINGE | INTRAVENOUS | Status: DC | PRN
  Administered 2022-05-10: 80 ug via INTRAVENOUS

## 2022-05-10 MED ORDER — OXYTOCIN 10 UNIT/ML IJ SOLN
INTRAMUSCULAR | Status: AC
  Filled 2022-05-10: qty 2

## 2022-05-10 MED ORDER — HYDROMORPHONE HCL 1 MG/ML IJ SOLN
0.5000 mg | INTRAMUSCULAR | Status: DC | PRN
  Administered 2022-05-10 (×2): 0.5 mg via INTRAVENOUS
  Administered 2022-05-11: 1 mg via INTRAVENOUS
  Administered 2022-05-11: 0.5 mg via INTRAVENOUS
  Filled 2022-05-10 (×2): qty 0.5
  Filled 2022-05-10: qty 1
  Filled 2022-05-10: qty 0.5

## 2022-05-10 MED ORDER — FENTANYL CITRATE (PF) 100 MCG/2ML IJ SOLN
INTRAMUSCULAR | Status: AC
  Filled 2022-05-10: qty 2

## 2022-05-10 MED ORDER — SUCCINYLCHOLINE CHLORIDE 200 MG/10ML IV SOSY
PREFILLED_SYRINGE | INTRAVENOUS | Status: DC | PRN
  Administered 2022-05-10: 140 mg via INTRAVENOUS

## 2022-05-10 MED ORDER — ONDANSETRON HCL 4 MG/2ML IJ SOLN: 4.0000 mg | Freq: Once | INTRAMUSCULAR | Status: DC | PRN

## 2022-05-10 MED ORDER — MISOPROSTOL 100 MCG PO TABS
ORAL_TABLET | ORAL | Status: DC | PRN
  Administered 2022-05-10: 800 ug

## 2022-05-10 MED ORDER — 0.9 % SODIUM CHLORIDE (POUR BTL) OPTIME
TOPICAL | Status: DC | PRN
  Administered 2022-05-10: 50 mL

## 2022-05-10 MED ORDER — CEFAZOLIN SODIUM-DEXTROSE 2-3 GM-%(50ML) IV SOLR
INTRAVENOUS | Status: DC | PRN
  Administered 2022-05-10: 2 g via INTRAVENOUS

## 2022-05-10 MED ORDER — FENTANYL CITRATE (PF) 100 MCG/2ML IJ SOLN
INTRAMUSCULAR | Status: DC | PRN
  Administered 2022-05-10: 50 ug via INTRAVENOUS

## 2022-05-10 MED ORDER — OXYTOCIN-SODIUM CHLORIDE 30-0.9 UT/500ML-% IV SOLN
INTRAVENOUS | Status: AC
  Filled 2022-05-10: qty 500

## 2022-05-10 MED ORDER — ONDANSETRON HCL 4 MG/2ML IJ SOLN
INTRAMUSCULAR | Status: DC | PRN
  Administered 2022-05-10: 4 mg via INTRAVENOUS

## 2022-05-10 MED ORDER — PROPOFOL 10 MG/ML IV BOLUS
INTRAVENOUS | Status: DC | PRN
  Administered 2022-05-10: 150 mg via INTRAVENOUS

## 2022-05-10 MED ORDER — FENTANYL CITRATE PF 50 MCG/ML IJ SOSY: 100.0000 ug | PREFILLED_SYRINGE | Freq: Once | INTRAMUSCULAR | Status: AC

## 2022-05-10 MED ORDER — OXYTOCIN 10 UNIT/ML IJ SOLN
INTRAMUSCULAR | Status: AC
  Filled 2022-05-10: qty 1

## 2022-05-10 MED ORDER — METHYLERGONOVINE MALEATE 0.2 MG/ML IJ SOLN
0.2000 mg | INTRAMUSCULAR | Status: AC
  Administered 2022-05-10: 0.2 mg via INTRAMUSCULAR
  Filled 2022-05-10: qty 1

## 2022-05-10 MED ORDER — FENTANYL CITRATE PF 50 MCG/ML IJ SOSY
PREFILLED_SYRINGE | INTRAMUSCULAR | Status: AC
  Filled 2022-05-10: qty 1

## 2022-05-10 MED ORDER — FENTANYL CITRATE PF 50 MCG/ML IJ SOSY
PREFILLED_SYRINGE | INTRAMUSCULAR | Status: AC
  Administered 2022-05-10: 100 ug via INTRAVENOUS
  Filled 2022-05-10: qty 1

## 2022-05-10 MED ORDER — RHO D IMMUNE GLOBULIN 1500 UNIT/2ML IJ SOSY
300.0000 ug | PREFILLED_SYRINGE | Freq: Once | INTRAMUSCULAR | Status: AC
  Administered 2022-05-10: 300 ug via INTRAVENOUS
  Filled 2022-05-10: qty 2

## 2022-05-10 SURGICAL SUPPLY — 26 items
DRSG TELFA 3X8 NADH STRL (GAUZE/BANDAGES/DRESSINGS) IMPLANT
FILTER UTR ASPR SPEC (MISCELLANEOUS) ×1 IMPLANT
FLTR UTR ASPR SPEC (MISCELLANEOUS) ×1
GLOVE BIO SURGEON STRL SZ7 (GLOVE) ×1 IMPLANT
GLOVE SURG UNDER LTX SZ7.5 (GLOVE) ×1 IMPLANT
GOWN STRL REUS W/ TWL LRG LVL3 (GOWN DISPOSABLE) ×2 IMPLANT
GOWN STRL REUS W/TWL LRG LVL3 (GOWN DISPOSABLE) ×2
KIT BERKELEY 1ST TRIMESTER 3/8 (MISCELLANEOUS) ×1 IMPLANT
KIT TURNOVER CYSTO (KITS) ×1 IMPLANT
MANIFOLD NEPTUNE II (INSTRUMENTS) ×1 IMPLANT
PACK DNC HYST (MISCELLANEOUS) ×1 IMPLANT
PAD OB MATERNITY 4.3X12.25 (PERSONAL CARE ITEMS) ×1 IMPLANT
PAD PREP 24X41 OB/GYN DISP (PERSONAL CARE ITEMS) ×1 IMPLANT
SCRUB CHG 4% DYNA-HEX 4OZ (MISCELLANEOUS) ×1 IMPLANT
SET BERKELEY SUCTION TUBING (SUCTIONS) ×1 IMPLANT
SET CYSTO W/LG BORE CLAMP LF (SET/KITS/TRAYS/PACK) IMPLANT
SOL PREP PVP 2OZ (MISCELLANEOUS) ×1
SOLUTION PREP PVP 2OZ (MISCELLANEOUS) ×1 IMPLANT
TOWEL OR 17X26 4PK STRL BLUE (TOWEL DISPOSABLE) ×1 IMPLANT
TRAP FLUID SMOKE EVACUATOR (MISCELLANEOUS) ×1 IMPLANT
VACURETTE 10 RIGID CVD (CANNULA) IMPLANT
VACURETTE 6 ASPIR F TIP BERK (CANNULA) IMPLANT
VACURETTE 7MM F TIP STRL (CANNULA) IMPLANT
VACURETTE 8 RIGID CVD (CANNULA) IMPLANT
VACURETTE 8MM F TIP (MISCELLANEOUS) ×1 IMPLANT
WATER STERILE IRR 500ML POUR (IV SOLUTION) ×1 IMPLANT

## 2022-05-10 NOTE — Progress Notes (Signed)
Postop Day  1  Subjective: Reports another episode of increased vaginal bleeding with several clots. Started to feel dizzy and light headed while sitting on the toilet.  Feeling a little better since getting back to bed but still feels weak.    Objective: BP 111/67 (BP Location: Left Arm)   Pulse 89   Temp 98.2 F (36.8 C) (Oral)   Resp 16   Ht 5\' 3"  (1.6 m)   Wt 90.3 kg   LMP 08/09/2021 (Exact Date)   SpO2 99%   Breastfeeding Unknown   BMI 35.25 kg/m    Physical Exam:  General: alert, no distress, and pale CV: RRR Pulm: nl effort Abdomen: soft, non-tender Uterine Fundus: firm Incision: no significant drainage Perineum: minimal edema, intact  Recent Labs    05/09/22 0929 05/10/22 0641  HGB 10.1* 8.8*  HCT 32.0* 27.7*  WBC 9.6 20.5*  PLT 267 255    Assessment/Plan: 31 y.o. 34 postpartum day # 2, delayed postpartum hemorrhage   -TXA and LR IV fluid bolus started -Place 2nd IV line -insert foley catheter  -Stat CBC ordered -Attempted manual sweep to remove multiple clots.  IV fentanyl given prior for pain.  Patient unable to tolerate manual sweep and I was not able to remove clots from lower uterine segment. -Dr. [id number] called and requested to bedside for assessment      LOS: 1 day   Dalbert Garnet, CNM 05/10/2022, 12:24 PM   ----- 05/12/2022  Certified Nurse Midwife [facility]

## 2022-05-10 NOTE — Transfer of Care (Signed)
Immediate Anesthesia Transfer of Care Note  Patient: Veronica Walters  Procedure(s) Performed: DILATATION AND EVACUATION  Patient Location: PACU  Anesthesia Type:General  Level of Consciousness: awake, drowsy, and patient cooperative  Airway & Oxygen Therapy: Patient Spontanous Breathing and Patient connected to face mask oxygen  Post-op Assessment: Report given to RN and Post -op Vital signs reviewed and stable  Post vital signs: Reviewed and stable  Last Vitals:  Vitals Value Taken Time  BP 116/76 05/10/22 1515  Temp 37.3 C 05/10/22 1458  Pulse 85 05/10/22 1516  Resp 20 05/10/22 1516  SpO2 99 % 05/10/22 1516  Vitals shown include unvalidated device data.  Last Pain:  Vitals:   05/10/22 1500  TempSrc:   PainSc: 8       Patients Stated Pain Goal: 0 (05/10/22 1500)  Complications: No notable events documented.

## 2022-05-10 NOTE — Op Note (Signed)
Veronica Walters PROCEDURE DATE:  05/10/2022  PREOPERATIVE DIAGNOSIS: Delayed Postpartum hemorrhage POSTOPERATIVE DIAGNOSIS: Uterine atony PROCEDURE:    Exam under anesthesia Dilation and Curettage Placement of Jada system  SURGEON:  Dr. Christeen Douglas  INDICATIONS: [name] with delayed hemorrhage after rLTCS at [redacted]w[redacted]d, needing emergent surgical evaluation after acute drop in hemoglobin from this morning, and significant clots being expelled every time she gets up to use the restroom.  On my bedside exam before surgery, I could feel significant clots at her cervical os, but due to discomfort was unable to extract them.  Therefore, we determined to come to the operating room.   Risks of surgery were discussed with the patient and her friend including but not limited to: bleeding which may require transfusion; infection which may require antibiotics; injury to uterus or surrounding organs; need for additional procedures including laparotomy or laparoscopy; possibility of intrauterine scarring which may impair future fertility; and other postoperative/anesthesia complications. Written informed consent was obtained.  FINDINGS:  Atonic fundus with significant clots and blood.  Total EBL approx , including the clots extracted prior to the start of this case in the operating room.  ANESTHESIA:    General INTRAVENOUS FLUIDS:  200 ml of LR, 200 mL of packed red blood cells ESTIMATED BLOOD LOSS:  here, and several pads of prior to OR since this morning. COMPLICATIONS:  None immediate.  PROCEDURE DETAILS:  The patient was then taken to the operating room where general anesthesia was administered and was found to be adequate.  After an adequate timeout was performed, she was placed in the dorsal lithotomy position and examined; then prepped and draped in the sterile manner.   A vaginal speculum was then placed in the patient's vagina and a ring forceps was applied to  the anterior lip of the cervix.  The cervix was already about 3 cm dilated;  A 10 mm suction curette was then advanced into the uterus, the suction device was then activated and curette slowly rotated. The suction curette was placed at the fundus, and bright red bleeding followed the first pass.  The bleeding did not improve, even with bimanual pressure and removing all the clots.  No retained products were noted.    A sharp curettage was then performed to confirm complete emptying of the uterus. There was significant bleeding noted so a Jada device was placed in standard fashion.  It was attached to 80 mmHg suction, and 60 mL of sterile water placed in the cervical portion.  PR Misoprostol 800 mcg was administered.   The bleeding was noted to subside significantly.   Plan for no more than 24 hours of the date in place, with reassessment in 5 hours unless continued bleeding or worsening symptoms.  All instruments were removed from the patient's vagina.  Sponge and instrument counts were correct times two.  The patient tolerated the procedure well and was taken to the recovery area awake, extubated and in stable condition.

## 2022-05-10 NOTE — Progress Notes (Signed)
Patient awake/alert x4. Jada device intact:  76ml suction per protocol. Bloody drainage noted. Indwelling foley in place. On-Q pump infusing via abd cavity. Medicated with fentanyl IV as ordered per anesthesia. Vitals stable.

## 2022-05-10 NOTE — Progress Notes (Signed)
Postop Day  1  Subjective: no complaints, up ad lib, voiding, and tolerating PO.  Reports that she had a large gush of blood this morning when up to the bathroom and then passed several clots.  Denies feeling dizzy, significant fatigue, or SOB.  Reports that she required a blood transfusion in a previous pregnancy d/t excessive postpartum bleeding.  Doing well, no concerns. Ambulating without difficulty, pain managed with PO meds, tolerating regular diet, and voiding without difficulty.   No fever/chills, chest pain, shortness of breath, nausea/vomiting, or leg pain. No nipple or breast pain. No headache, visual changes, or RUQ/epigastric pain.  Objective: BP 110/68 (BP Location: Right Arm)   Pulse 74   Temp 98.2 F (36.8 C) (Oral)   Resp 16   Ht 5\' 3"  (1.6 m)   Wt 90.3 kg   LMP 08/09/2021 (Exact Date)   SpO2 99%   Breastfeeding Unknown   BMI 35.25 kg/m    Physical Exam:  General: alert, cooperative, and pale Breasts: soft/nontender CV: RRR Pulm: nl effort, CTABL Abdomen: soft, non-tender, active bowel sounds Uterine Fundus: firm Incision: no significant drainage, covered with occlusive OP site  Perineum: minimal edema, intact Lochia: appropriate DVT Evaluation: No evidence of DVT seen on physical exam.  Recent Labs    05/09/22 0929 05/10/22 0641  HGB 10.1* 8.8*  HCT 32.0* 27.7*  WBC 9.6 20.5*  PLT 267 255    Assessment/Plan: 31 y.o. 34 postpartum day # 1  -Continue routine postpartum care -Lactation consult PRN for breastfeeding  -Acute blood loss anemia - hemodynamically stable and asymptomatic; start PO ferrous sulfate BID with stool softeners  -1 dose of IV Venofer infusing now  -IM Methergine given for postpartum bleeding and clots.  QBL 469 ml in addition to clots and blood loss in toilet.  Will continue to weigh pads  -Discussed blood transfusion if needed. Risk/benefits discussed.  Emagene consents to blood transfusion if needed.  -Plan to repeat CBC  this afternoon.  -Dr. [id number] updated on interventions and plan of care.    Disposition: Continue inpatient postpartum care   LOS: 1 day   Dalbert Garnet, Gustavo Lah 05/10/2022, 9:39 AM   ----- 05/12/2022  Certified Nurse Midwife [facility]

## 2022-05-10 NOTE — Anesthesia Postprocedure Evaluation (Signed)
Anesthesia Post Note  Patient: Veronica Walters  Procedure(s) Performed: REPEAT CESAREAN SECTION  Patient location during evaluation: Mother Baby Anesthesia Type: Spinal Level of consciousness: oriented and awake and alert Pain management: pain level controlled Vital Signs Assessment: post-procedure vital signs reviewed and stable Respiratory status: spontaneous breathing and respiratory function stable Cardiovascular status: blood pressure returned to baseline and stable Postop Assessment: no headache, no backache, no apparent nausea or vomiting, able to ambulate and patient able to bend at knees Anesthetic complications: no  No notable events documented.   Last Vitals:  Vitals:   05/10/22 0345 05/10/22 0821  BP: (!) 105/56 110/68  Pulse: 71 74  Resp: 18 16  Temp: 36.8 C 36.8 C  SpO2: 98% 99%    Last Pain:  Vitals:   05/10/22 0821  TempSrc: Oral  PainSc:                  [name]

## 2022-05-10 NOTE — Anesthesia Procedure Notes (Signed)
Procedure Name: Intubation Date/Time: 05/10/2022 2:13 PM  Performed by: Cheral Bay, CRNAPre-anesthesia Checklist: Patient identified, Emergency Drugs available, Suction available and Patient being monitored Patient Re-evaluated:Patient Re-evaluated prior to induction Oxygen Delivery Method: Circle system utilized Preoxygenation: Pre-oxygenation with 100% oxygen Induction Type: IV induction and Rapid sequence Laryngoscope Size: McGraph and 3 Grade View: Grade I Tube type: Oral Tube size: 7.0 mm Number of attempts: 1 Airway Equipment and Method: Stylet Placement Confirmation: ETT inserted through vocal cords under direct vision, positive ETCO2 and breath sounds checked- equal and bilateral Secured at: 21 cm Tube secured with: Tape Dental Injury: Teeth and Oropharynx as per pre-operative assessment

## 2022-05-10 NOTE — Anesthesia Preprocedure Evaluation (Signed)
Anesthesia Evaluation  Patient identified by MRN, date of birth, ID band Patient awake    Reviewed: Allergy & Precautions, H&P , NPO status , Patient's Chart, lab work & pertinent test results, reviewed documented beta blocker date and time   Airway Mallampati: II  TM Distance: >3 FB Neck ROM: full    Dental  (+) Teeth Intact   Pulmonary neg pulmonary ROS, former smoker   Pulmonary exam normal        Cardiovascular Exercise Tolerance: Good negative cardio ROS Normal cardiovascular exam Rhythm:regular Rate:Normal     Neuro/Psych negative neurological ROS  negative psych ROS   GI/Hepatic negative GI ROS, Neg liver ROS,,,  Endo/Other  negative endocrine ROS    Renal/GU negative Renal ROS  negative genitourinary   Musculoskeletal   Abdominal   Peds  Hematology  (+) Blood dyscrasia, anemia   Anesthesia Other Findings Past Medical History: No date: Anemia 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 No date: Hx of transfusion of whole blood Past Surgical History: 03/16/2015: CESAREAN SECTION; N/A     Comment:  Procedure: CESAREAN SECTION;  Surgeon: Conard Novak, MD;  Location: [facility] ORS;  Service: Obstetrics;                Laterality: N/A; 05/09/2022: CESAREAN SECTION; N/A     Comment:  Procedure: REPEAT CESAREAN SECTION;  Surgeon: Conard Novak, MD;  Location: [facility] ORS;  Service: Obstetrics;              Laterality: N/A; 2006, 2010, 2013: THERAPEUTIC ABORTION BMI    Body Mass Index: 35.25 kg/m     Reproductive/Obstetrics negative OB ROS                             Anesthesia Physical Anesthesia Plan  ASA: 2 and emergent  Anesthesia Plan: General ETT   Post-op Pain Management:    Induction:   PONV Risk Score and Plan:   Airway Management Planned:   Additional Equipment:   Intra-op Plan:   Post-operative Plan:   Informed Consent: I have reviewed the  patients History and Physical, chart, labs and discussed the procedure including the risks, benefits and alternatives for the proposed anesthesia with the patient or authorized representative who has indicated his/her understanding and acceptance.     Dental Advisory Given  Plan Discussed with: CRNA  Anesthesia Plan Comments:        Anesthesia Quick Evaluation

## 2022-05-10 NOTE — Progress Notes (Signed)
Called to bedside to assess clots and bleeding. Recent H/H with hgb drop to from 8.8 this morning to 7.4 today, down from 10.0 on admit. Pt fatigued and dizzy, +orthostatics. On exam, sig clots in cervix, fundus boggy. IV fentanyl insufficient for uterine evaluation. She has received fluid bolus, 1g iv txa and methergine.  Plan for exam under anesthesia, suction D&C with possible jada placement. Foley in place.  2u pRBCs ordered, not yet started.  OR aware, consents signed. Urgent case, pt not NPO.

## 2022-05-11 LAB — TYPE AND SCREEN
ABO/RH(D): A NEG
Antibody Screen: POSITIVE
Unit division: 0
Unit division: 0

## 2022-05-11 LAB — COMPREHENSIVE METABOLIC PANEL
ALT: 14 U/L (ref 0–44)
AST: 25 U/L (ref 15–41)
Albumin: 2.4 g/dL — ABNORMAL LOW (ref 3.5–5.0)
Alkaline Phosphatase: 231 U/L — ABNORMAL HIGH (ref 38–126)
Anion gap: 3 — ABNORMAL LOW (ref 5–15)
BUN: 5 mg/dL — ABNORMAL LOW (ref 6–20)
CO2: 25 mmol/L (ref 22–32)
Calcium: 7.9 mg/dL — ABNORMAL LOW (ref 8.9–10.3)
Chloride: 107 mmol/L (ref 98–111)
Creatinine, Ser: 0.36 mg/dL — ABNORMAL LOW (ref 0.44–1.00)
GFR, Estimated: >60 mL/min (ref >60)
Glucose, Bld: 86 mg/dL (ref 70–99)
Potassium: 4.2 mmol/L (ref 3.5–5.1)
Sodium: 135 mmol/L (ref 135–145)
Total Bilirubin: 0.7 mg/dL (ref 0.3–1.2)
Total Protein: 5 g/dL — ABNORMAL LOW (ref 6.5–8.1)

## 2022-05-11 LAB — RHOGAM INJECTION: Unit division: 0

## 2022-05-11 LAB — CBC
HCT: 25.3 % — ABNORMAL LOW (ref 36.0–46.0)
Hemoglobin: 8.4 g/dL — ABNORMAL LOW (ref 12.0–15.0)
MCH: 27.8 pg (ref 26.0–34.0)
MCHC: 33.2 g/dL (ref 30.0–36.0)
MCV: 83.8 fL (ref 80.0–100.0)
Platelets: 210 10*3/uL (ref 150–400)
RBC: 3.02 MIL/uL — ABNORMAL LOW (ref 3.87–5.11)
RDW: 18.4 % — ABNORMAL HIGH (ref 11.5–15.5)
WBC: 19.2 10*3/uL — ABNORMAL HIGH (ref 4.0–10.5)
nRBC: 0.2 % (ref 0.0–0.2)

## 2022-05-11 LAB — BPAM RBC
Blood Product Expiration Date: 202401112359
Blood Product Expiration Date: 202401172359
ISSUE DATE / TIME: 202312291322
ISSUE DATE / TIME: 202312291640
Unit Type and Rh: 600
Unit Type and Rh: 600

## 2022-05-11 LAB — HEMOGLOBIN AND HEMATOCRIT, BLOOD
HCT: 27.2 % — ABNORMAL LOW (ref 36.0–46.0)
Hemoglobin: 8.9 g/dL — ABNORMAL LOW (ref 12.0–15.0)

## 2022-05-11 NOTE — Lactation Note (Signed)
This note was copied from a baby's chart. Lactation Consultation Note  Patient Name: Veronica Walters [id number]'P Date: 05/11/2022 Reason for consult: Follow-up assessment;Term;Other (Comment);Breastfeeding assistance (repeat C/S, mom requested assistance assembling motif pump.) Age:32 hours  Maternal Data This is mom's 4 th baby, elective repeat C/S. Mom with history of anemia. Mom reports she has breastfeeding experience. Her plan is to breastfeed and offer formula supplement by choice.  On follow-up today mom reports baby doesn't like the formula and seems to prefer breastfeeding. Mom requested breastfeeding assistance at the right breast which baby was having some latch difficulty. Mom also concerned about how stuffy baby's nose is. Has patient been taught Hand Expression?: Yes Does the patient have breastfeeding experience prior to this delivery?: Yes  Feeding Mother's Current Feeding Choice: Breast Milk and Formula Provided mom with tips and strategies to maximize position and latch techniques. LATCH Score Latch: Grasps breast easily, tongue down, lips flanged, rhythmical sucking.  Audible Swallowing: Spontaneous and intermittent  Type of Nipple: Everted at rest and after stimulation  Comfort (Breast/Nipple): Filling, red/small blisters or bruises, mild/mod discomfort  Hold (Positioning): Assistance needed to correctly position infant at breast and maintain latch.  LATCH Score: 8   Lactation Tools Discussed/Used  Motif mom brought from home.  Interventions Interventions: Breast feeding basics reviewed;Assisted with latch;Breast massage;Hand express;Breast compression;Adjust position;Support pillows;Education;Hand pump (personal use pump brought from home)  Discharge Pump: Personal (Has a motif pump.)  Consult Status Consult Status: Follow-up Date: 05/12/22 Follow-up type: In-patient  Update provided to care nurse Skyler regarding breastfeeding, mom's concern for baby's  stuffy nose, and baby noted to be breathing faster after feeding.   Fuller Song 05/11/2022, 6:05 PM

## 2022-05-11 NOTE — Progress Notes (Signed)
RN to patient room for rounds. RN unable to locate pt fundus. Pt has Jada in place. RN called L&D RN to assess. 2nd RN was not able to locate. A.Mackie called. Order to stop suction on Jada and recheck in 1 hr. Suctioned stopped at 0340.

## 2022-05-11 NOTE — Progress Notes (Signed)
S: Ula feeling better than yesterday but still feels tired.  Having abdominal pain since JADA placement that refers to rectum.  Pain has improved since suction was turned off this morning.   O: Vitals:   05/11/22 0340 05/11/22 0734  BP: 113/74 126/81  Pulse: 62 61  Resp: 18 20  Temp: 97.9 F (36.6 C) 98.3 F (36.8 C)  SpO2: 99% 100%   Physical Exam:  General: alert, no distress, and pale CV: RRR Pulm: nl effort Abdomen: soft, non-tender Uterine Fundus: firm Lochia: small  Incision: no significant drainage Perineum: minimal edema, intact     Latest Ref Rng & Units 05/10/2022   11:35 PM 05/10/2022   12:25 PM 05/10/2022    6:41 AM  CBC  WBC 4.0 - 10.5 K/uL  19.7  20.5   Hemoglobin 12.0 - 15.0 g/dL 8.9  7.4  8.8   Hematocrit 36.0 - 46.0 % 27.2  23.3  27.7   Platelets 150 - 400 K/uL  231  255    Assessment: Postpartum hemorrhage  Acute blood loss anemia   Plan: -47ml of saline removed from JADA.  Device easily removed with gentle traction.   -Fundus removed firm with small lochia -Will leave foley catheter in for 2 hours while monitoring vaginal bleeding -CBC and CMP pending for this morning.   -Activity as tolerated - balance rest with activity  -Continue routine postpartum care  -Dr. Dalbert Garnet updated on assessment   Margaretmary Eddy, CNM Certified Nurse Midwife [facility]

## 2022-05-11 NOTE — Progress Notes (Signed)
Post Partum Day 2 Subjective: Doing well, still having abdominal pain.  Tolerating regular diet, pain with PO meds, voiding and ambulating without difficulty.  No CP SOB Fever,Chills, N/V or leg pain; denies nipple or breast pain, no HA change of vision, RUQ/epigastric pain  Objective: BP 123/84 (BP Location: Right Arm)   Pulse 72   Temp 99.1 F (37.3 C) (Oral)   Resp 19   Ht 5\' 3"  (1.6 m)   Wt 90.3 kg   LMP 08/09/2021 (Exact Date)   SpO2 100%   Breastfeeding Unknown   BMI 35.25 kg/m    Physical Exam:  General: NAD Breasts: soft/nontender CV: RRR Pulm: nl effort, CTABL Abdomen: soft, NT, BS x 4 Incision: Dsg CDI/Steristrips intact/no erythema or drainage Perineum: minimal edema, intact Lochia: moderate, silver-dollar-sized clot passed when ambulating Uterine Fundus: fundus firm and 1 fb below umbilicus DVT Evaluation: no cords, ttp LEs   Recent Labs    05/10/22 1225 05/10/22 2335 05/11/22 0719  HGB 7.4* 8.9* 8.4*  HCT 23.3* 27.2* 25.3*  WBC 19.7*  --  19.2*  PLT 231  --  210    Assessment/Plan: 31 y.o. 32 postpartum day # 2  - Continue routine PP care - Lactation consult PRN - Discussed contraceptive options including implant, IUDs hormonal and non-hormonal, injection, pills/ring/patch, condoms, and NFP.  - Acute blood loss anemia - hemodynamically stable and asymptomatic; start po ferrous sulfate BID with stool softeners. Has previously received 2u PRBCs and IV Venofer.  - delayed postpartum hemorrhage: Jada removed this morning. Received blood products and IV iron previously. Bleeding today is light but she did pass one silver dollar-sized blood clot this morning when ambulating. - Immunization status: all Imms up to date  Disposition: Does not desire Dc home today.   [id number], CNM 05/11/2022 1:57 PM

## 2022-05-12 ENCOUNTER — Encounter: Payer: Self-pay | Admitting: Obstetrics and Gynecology

## 2022-05-12 MED ORDER — OXYCODONE HCL 5 MG PO TABS: 5.0000 mg | ORAL_TABLET | ORAL | 0 refills | Status: DC | PRN

## 2022-05-12 MED ORDER — IBUPROFEN 600 MG PO TABS: 600.0000 mg | ORAL_TABLET | Freq: Four times a day (QID) | ORAL | 0 refills | Status: DC

## 2022-05-12 MED ORDER — IBUPROFEN 800 MG PO TABS: 800.0000 mg | ORAL_TABLET | Freq: Three times a day (TID) | ORAL | 0 refills | Status: DC | PRN

## 2022-05-12 MED ORDER — FERROUS SULFATE 325 (65 FE) MG PO TABS: 325.0000 mg | ORAL_TABLET | Freq: Two times a day (BID) | ORAL | 3 refills | Status: AC

## 2022-05-12 MED ORDER — OXYCODONE HCL 5 MG PO TABS: 5.0000 mg | ORAL_TABLET | ORAL | 0 refills | Status: AC | PRN

## 2022-05-12 MED ORDER — FERROUS SULFATE 325 (65 FE) MG PO TABS: 325.0000 mg | ORAL_TABLET | Freq: Two times a day (BID) | ORAL | 3 refills | Status: DC

## 2022-05-12 NOTE — Lactation Note (Signed)
This note was copied from a baby's chart. Lactation Consultation Note  Patient Name: Veronica Walters [id number] Date: 05/12/2022 Reason for consult: Follow-up assessment;RN request;Nipple pain/trauma;Engorgement Age:32 hours  Maternal Data Has patient been taught Hand Expression?: Yes  Feeding Mother's Current Feeding Choice: Breast Milk and Formula  LATCH Score Latch: Grasps breast easily, tongue down, lips flanged, rhythmical sucking.  Audible Swallowing: Spontaneous and intermittent  Type of Nipple: Everted at rest and after stimulation  Comfort (Breast/Nipple): Filling, red/small blisters or bruises, mild/mod discomfort  Hold (Positioning): No assistance needed to correctly position infant at breast.  LATCH Score: 9   Lactation Tools Discussed/Used    Interventions Interventions: Breast feeding basics reviewed;Breast compression;Breast massage;Support pillows;Coconut oil;Ice;Education;Infant Driven Feeding Algorithm education  Latched with rhythmic sucking and numerous audible swallows on left side in football hold for 20 minutes. Full areas, especially near tail of Spence noted at beginning of feeding and softer by the end of the feeding. Mom shown compression and gentle massage for firmer areas. Pain with initial latch but eased during feeding. Lips flanged. Declined to feed on right side but encouraged to feed from both sides at each feeding. Has motif pump in room and declined pump on right at this time. Ice packs applied bilateral and patient instructed to keep them in place for 20 minutes after each feeding as needed for engorgement discomfort.    Discharge Discharge Education: Engorgement and breast care Pump: Personal WIC Program: Yes  Consult Status Consult Status: Follow-up    Matt Holmes 05/12/2022, 5:19 PM

## 2022-05-14 NOTE — Anesthesia Postprocedure Evaluation (Signed)
Anesthesia Post Note  Patient: Veronica Walters  Procedure(s) Performed: DILATATION AND EVACUATION  Patient location during evaluation: PACU Anesthesia Type: General Level of consciousness: awake and alert Pain management: pain level controlled Vital Signs Assessment: post-procedure vital signs reviewed and stable Respiratory status: spontaneous breathing, nonlabored ventilation, respiratory function stable and patient connected to nasal cannula oxygen Cardiovascular status: blood pressure returned to baseline and stable Postop Assessment: no apparent nausea or vomiting Anesthetic complications: no   No notable events documented.   Last Vitals:  Vitals:   05/12/22 0443 05/12/22 0807  BP: 114/63 115/87  Pulse: 69 73  Resp: 18 17  Temp: 36.7 C 36.7 C  SpO2: 99% 99%    Last Pain:  Vitals:   05/12/22 1747  TempSrc:   PainSc: Home [name]

## 2022-05-28 ENCOUNTER — Telehealth: Payer: Self-pay

## 2022-05-28 NOTE — Telephone Encounter (Signed)
[facility]- Discharge Call Backs-Pt did not answer.  Left her a voice mail about the information listed below. 1-Do you have any questions or concerns about yourself as you heal?  C-Sec-Is your dressing off? 2-Any concerns or questions about your baby? Is your baby eating, peeing,pooping well? 3-Review ABC's of safe sleep. 4-How was your stay at the hospital? 5-How did our team work together to care for you? You should be receiving a survey in the mail soon.   We would really appreciate it if you could fill that out for [place] and return it in the mail.  We value the feedback to make improvements and continue the great work we do.   If you have any questions please feel free to call me back at [phone]

## 2022-06-19 ENCOUNTER — Other Ambulatory Visit: Payer: Self-pay | Admitting: Primary Care

## 2022-06-19 DIAGNOSIS — R1030 Lower abdominal pain, unspecified: Secondary | ICD-10-CM
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
## 2022-06-24 ENCOUNTER — Ambulatory Visit: Payer: Medicaid Other

## 2022-12-09 ENCOUNTER — Emergency Department
Admission: EM | Admit: 2022-12-09 | Discharge: 2022-12-09 | Disposition: A | Discharge status: Home / Self Care | Payer: Medicaid Other | Attending: Emergency Medicine | Admitting: Emergency Medicine

## 2022-12-09 ENCOUNTER — Emergency Department: Payer: Medicaid Other

## 2022-12-09 ENCOUNTER — Other Ambulatory Visit: Payer: Self-pay

## 2022-12-09 DIAGNOSIS — Y92410 Unspecified street and highway as the place of occurrence of the external cause: Secondary | ICD-10-CM | POA: Diagnosis not present

## 2022-12-09 DIAGNOSIS — M545 Low back pain, unspecified: Secondary | ICD-10-CM | POA: Insufficient documentation

## 2022-12-09 LAB — POC URINE PREG, ED: Preg Test, Ur: NEGATIVE

## 2022-12-09 MED ORDER — NAPROXEN 500 MG PO TABS
500.0000 mg | ORAL_TABLET | Freq: Once | ORAL | Status: AC
  Administered 2022-12-09: 500 mg via ORAL
  Filled 2022-12-09: qty 1

## 2022-12-09 MED ORDER — NAPROXEN 500 MG PO TABS: 500.0000 mg | ORAL_TABLET | Freq: Two times a day (BID) | ORAL | 0 refills | Status: AC

## 2022-12-09 MED ORDER — CYCLOBENZAPRINE HCL 10 MG PO TABS: 10.0000 mg | ORAL_TABLET | Freq: Three times a day (TID) | ORAL | 0 refills | Status: AC | PRN
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
## 2022-12-31 ENCOUNTER — Other Ambulatory Visit: Payer: Self-pay

## 2022-12-31 ENCOUNTER — Emergency Department: Payer: Medicaid Other

## 2022-12-31 ENCOUNTER — Encounter: Payer: Self-pay | Admitting: Emergency Medicine

## 2022-12-31 DIAGNOSIS — U071 COVID-19: Secondary | ICD-10-CM | POA: Diagnosis not present

## 2022-12-31 DIAGNOSIS — R509 Fever, unspecified: Secondary | ICD-10-CM | POA: Diagnosis present

## 2022-12-31 LAB — SARS CORONAVIRUS 2 BY RT PCR: SARS Coronavirus 2 by RT PCR: POSITIVE — AB

## 2022-12-31 LAB — GROUP A STREP BY PCR: Group A Strep by PCR: NOT DETECTED

## 2022-12-31 NOTE — ED Triage Notes (Addendum)
Patient ambulatory to triage with steady gait, without difficulty or distress noted; pt reports since yesterday having body aches, fever, prod cough green sputum, congestion, sore throat

## 2023-01-01 ENCOUNTER — Telehealth: Payer: Self-pay | Admitting: Emergency Medicine

## 2023-01-01 ENCOUNTER — Emergency Department
Admission: EM | Admit: 2023-01-01 | Discharge: 2023-01-01 | Disposition: A | Discharge status: Home / Self Care | Payer: Medicaid Other | Attending: Emergency Medicine | Admitting: Emergency Medicine

## 2023-01-01 DIAGNOSIS — U071 COVID-19: Secondary | ICD-10-CM

## 2023-01-01 MED ORDER — NIRMATRELVIR/RITONAVIR (PAXLOVID)TABLET: 3.0000 | ORAL_TABLET | Freq: Two times a day (BID) | ORAL | 0 refills | Status: AC

## 2023-01-01 MED ORDER — NIRMATRELVIR/RITONAVIR (PAXLOVID)TABLET: 3.0000 | ORAL_TABLET | Freq: Two times a day (BID) | ORAL | 0 refills | Status: DC

## 2023-01-01 MED ORDER — IBUPROFEN 800 MG PO TABS
800.0000 mg | ORAL_TABLET | Freq: Once | ORAL | Status: AC
  Administered 2023-01-01: 800 mg via ORAL
  Filled 2023-01-01: qty 1

## 2023-01-01 NOTE — Telephone Encounter (Signed)
Patient requested prescription be sent to another pharmacy

## 2023-01-01 NOTE — Discharge Instructions (Addendum)
You have been diagnosed with COVID 19.  This is a virus that can cause many different symptoms and can be extremely contagious.    You may be eligible for outpatient antiviral treatments for COVID 19 such as Paxlovid, Molnupiravir if you are within the first 5 days of symptoms. You do not need antibiotics for COVID 19 since it is a virus.  You may use over the counter medications to help manage your symptoms at home.    You may alternate Tylenol 1000 mg every 6 hours as needed for pain, fever (as long as you have no history of liver dysfunction) and Ibuprofen 800 mg every 8 hours as needed for pain, fever (as long as you have no history of kidney dysfunction).  Please take Ibuprofen with food.  Do not take more than 4000 mg of Tylenol (acetaminophen) in a 24 hour period.  Please rest and drink plenty of fluids.  You will need to quarantine from others for five days (first day of symptoms is DAY ZERO).  If your symptoms are improving or resolved at the end of this time frame, you may come out of quarantine but will need to wear a well fitted mask when around others for the next 5 days.   The best way to protect yourself and others from COVID 19 and potential long term complications is to be vaccinated and receive boosters as recommended by the CDC and your primary care provider.  If you develop shortness of breath, blue lips or blue fingertips, vomiting that does not stop, chest pain, confusion, become severely weak or feel you may pass out, please return to the closest emergency department.   

## 2023-01-01 NOTE — ED Provider Notes (Signed)
[facility] Provider Note    Event Date/Time   First MD Initiated Contact with Patient 01/01/23 0140     (approximate)   History   Fever   HPI  Veronica Walters is a 32 y.o. female with no significant past medical history who presents to the emergency department with 2 days of headache, fever, cough, body aches.  No vomiting or diarrhea. History provided by patient.    Past Medical History:  Diagnosis Date   Anemia    Herpes labialis    Hx of transfusion of whole blood     Past Surgical History:  Procedure Laterality Date   ABDOMINAL SURGERY     CESAREAN SECTION N/A 03/16/2015   Procedure: CESAREAN SECTION;  Surgeon: Conard Novak, MD;  Location: [facility] ORS;  Service: Obstetrics;  Laterality: N/A;   CESAREAN SECTION N/A 05/09/2022   Procedure: REPEAT CESAREAN SECTION;  Surgeon: Conard Novak, MD;  Location: [facility] ORS;  Service: Obstetrics;  Laterality: N/A;   DILATION AND EVACUATION N/A 05/10/2022   Procedure: DILATATION AND EVACUATION;  Surgeon: Christeen Douglas, MD;  Location: [facility] ORS;  Service: Gynecology;  Laterality: N/A;   THERAPEUTIC ABORTION  2006, 2010, 2013    MEDICATIONS:  Prior to Admission medications   Medication Sig Start Date End Date Taking? Authorizing Provider  acetaminophen (TYLENOL) 500 MG tablet Take 2 tablets (1,000 mg total) by mouth every 6 (six) hours as needed for fever or headache. 02/16/22   Sonny Dandy, CNM  cyclobenzaprine (FLEXERIL) 10 MG tablet Take 1 tablet (10 mg total) by mouth 3 (three) times daily as needed. 12/09/22   Triplett, Rulon Eisenmenger B, FNP  ferrous sulfate 325 (65 FE) MG tablet Take 1 tablet (325 mg total) by mouth 2 (two) times daily with a meal. 05/12/22   Wilson, Marny Lowenstein, CNM  naproxen (NAPROSYN) 500 MG tablet Take 1 tablet (500 mg total) by mouth 2 (two) times daily with a meal. 12/09/22   Triplett, Cari B, FNP  Prenatal Vit-Fe Fumarate-FA (PRENATAL  MULTIVITAMIN) TABS tablet Take 1 tablet by mouth daily at 12 noon.    [provider]    Physical Exam   Triage Vital Signs: ED Triage Vitals  Encounter Vitals Group     BP 12/31/22 2254 114/80     Systolic BP Percentile --      Diastolic BP Percentile --      Pulse Rate 12/31/22 2254 73     Resp 12/31/22 2254 18     Temp 12/31/22 2254 99 F (37.2 C)     Temp Source 12/31/22 2254 Oral     SpO2 12/31/22 2254 100 %     Weight 12/31/22 2232 179 lb (81.2 kg)     Height 12/31/22 2232 5\' 3"  (1.6 m)     Head Circumference --      Peak Flow --      Pain Score 12/31/22 2232 8     Pain Loc --      Pain Education --      Exclude from Growth Chart --     Most recent vital signs: Vitals:   12/31/22 2254  BP: 114/80  Pulse: 73  Resp: 18  Temp: 99 F (37.2 C)  SpO2: 100%    CONSTITUTIONAL: Alert, responds appropriately to questions. Well-appearing; well-nourished HEAD: Normocephalic, atraumatic EYES: Conjunctivae clear, pupils appear equal, sclera nonicteric ENT: normal nose; moist mucous membranes, No pharyngeal erythema or petechiae, no tonsillar hypertrophy or  exudate, no uvular deviation, no unilateral swelling in posterior oropharynx, no trismus or drooling, no muffled voice, normal phonation, no stridor, airway patent. NECK: Supple, normal ROM CARD: RRR; S1 and S2 appreciated RESP: Normal chest excursion without splinting or tachypnea; breath sounds clear and equal bilaterally; no wheezes, no rhonchi, no rales, no hypoxia or respiratory distress, speaking full sentences ABD/GI: Non-distended; soft, non-tender, no rebound, no guarding, no peritoneal signs BACK: The back appears normal EXT: Normal ROM in all joints; no deformity noted, no edema SKIN: Normal color for age and race; warm; no rash on exposed skin NEURO: Moves all extremities equally, normal speech PSYCH: The patient's mood and manner are appropriate.   ED Results / Procedures / Treatments    LABS: (all labs ordered are listed, but only abnormal results are displayed) Labs Reviewed  SARS CORONAVIRUS 2 BY RT PCR - Abnormal; Notable for the following components:      Result Value   SARS Coronavirus 2 by RT PCR POSITIVE (*)    All other components within normal limits  GROUP A STREP BY PCR     EKG:  EKG Interpretation Date/Time:    Ventricular Rate:    PR Interval:    QRS Duration:    QT Interval:    QTC Calculation:   R Axis:      Text Interpretation:           RADIOLOGY: My personal review and interpretation of imaging: Chest x-ray clear.  I have personally reviewed all radiology reports.   DG Chest 2 View  Result Date: 12/31/2022 CLINICAL DATA:  Cough EXAM: CHEST - 2 VIEW COMPARISON:  None Available. FINDINGS: The heart size and mediastinal contours are within normal limits. Both lungs are clear. The visualized skeletal structures are unremarkable. IMPRESSION: No active cardiopulmonary disease. Electronically Signed   By: Helyn Numbers M.D.   On: 12/31/2022 23:30     PROCEDURES:  Critical Care performed: No   CRITICAL CARE Performed by: Baxter Hire Iva Posten   Total critical care time: 0 minutes  Critical care time was exclusive of separately billable procedures and treating other patients.  Critical care was necessary to treat or prevent imminent or life-threatening deterioration.  Critical care was time spent personally by me on the following activities: development of treatment plan with patient and/or surrogate as well as nursing, discussions with consultants, evaluation of patient's response to treatment, examination of patient, obtaining history from patient or surrogate, ordering and performing treatments and interventions, ordering and review of laboratory studies, ordering and review of radiographic studies, pulse oximetry and re-evaluation of patient's condition.   Procedures    IMPRESSION / MDM / ASSESSMENT AND PLAN / ED COURSE  I  reviewed the triage vital signs and the nursing notes.    Patient here with flulike symptoms.   DIFFERENTIAL DIAGNOSIS (includes but not limited to):   COVID, influenza, strep, pneumonia   Patient's presentation is most consistent with acute complicated illness / injury requiring diagnostic workup.   PLAN: Workup initiated from triage.  Strep negative.  Patient is COVID-positive.  Chest x-ray reviewed and interpreted by myself and the radiologist and is unremarkable.  Will discharge on Paxlovid.  Discussed supportive care instructions.  Will give ibuprofen here.  Patient is well-appearing, nontoxic.   MEDICATIONS GIVEN IN ED: Medications  ibuprofen (ADVIL) tablet 800 mg (800 mg Oral Given 01/01/23 0219)     ED COURSE:  At this time, I do not feel there is any life-threatening condition present. I  reviewed all nursing notes, vitals, pertinent previous records.  All lab and urine results, EKGs, imaging ordered have been independently reviewed and interpreted by myself.  I reviewed all available radiology reports from any imaging ordered this visit.  Based on my assessment, I feel the patient is safe to be discharged home without further emergent workup and can continue workup as an outpatient as needed. Discussed all findings, treatment plan as well as usual and customary return precautions.  They verbalize understanding and are comfortable with this plan.  Outpatient follow-up has been provided as needed.  All questions have been answered.    CONSULTS:  none   OUTSIDE RECORDS REVIEWED: Reviewed last OB/GYN note March 2024.       FINAL CLINICAL IMPRESSION(S) / ED DIAGNOSES   Final diagnoses:  COVID-19     Rx / DC Orders   ED Discharge Orders          Ordered    nirmatrelvir/ritonavir (PAXLOVID) 20 x 150 MG & 10 x 100MG  TABS  2 times daily        01/01/23 0205             Note:  This document was prepared using Dragon voice recognition software and may include  unintentional dictation errors.   Lian Pounds, Layla Maw, DO 01/01/23 [phone]

## 2023-06-06 ENCOUNTER — Ambulatory Visit (LOCAL_COMMUNITY_HEALTH_CENTER): Payer: Medicaid Other

## 2023-06-06 DIAGNOSIS — Z111 Encounter for screening for respiratory tuberculosis: Secondary | ICD-10-CM

## 2023-06-09 ENCOUNTER — Ambulatory Visit: Payer: Medicaid Other

## 2023-06-09 DIAGNOSIS — Z111 Encounter for screening for respiratory tuberculosis: Secondary | ICD-10-CM

## 2023-06-09 LAB — TB SKIN TEST
Induration: 0 mm
TB Skin Test: NEGATIVE

## 2023-06-13 ENCOUNTER — Ambulatory Visit: Payer: Medicaid Other

## 2023-06-13 DIAGNOSIS — Z111 Encounter for screening for respiratory tuberculosis: Secondary | ICD-10-CM

## 2023-06-16 ENCOUNTER — Ambulatory Visit: Payer: Medicaid Other

## 2023-06-16 DIAGNOSIS — Z111 Encounter for screening for respiratory tuberculosis: Secondary | ICD-10-CM

## 2023-06-16 LAB — TB SKIN TEST
Induration: 0 mm
TB Skin Test: NEGATIVE
